# Patient Record
Sex: Female | Born: 1990 | Race: White | Hispanic: No | Smoking: Current every day smoker
Health system: Southern US, Community
[De-identification: ages and names within clinical notes are randomized; demographics above are authoritative.]

## PROBLEM LIST (undated history)

## (undated) DIAGNOSIS — N200 Calculus of kidney: Secondary | ICD-10-CM

## (undated) DIAGNOSIS — O1490 Unspecified pre-eclampsia, unspecified trimester: Secondary | ICD-10-CM

## (undated) HISTORY — PX: CHOLECYSTECTOMY: SHX55

## (undated) HISTORY — PX: LITHOTRIPSY: SUR834

## (undated) HISTORY — DX: Unspecified pre-eclampsia, unspecified trimester: O14.90

---

## 2019-12-09 ENCOUNTER — Ambulatory Visit: Payer: Self-pay

## 2019-12-14 ENCOUNTER — Ambulatory Visit: Payer: Self-pay | Admitting: Internal Medicine

## 2019-12-14 ENCOUNTER — Encounter: Payer: Self-pay | Admitting: Internal Medicine

## 2019-12-14 ENCOUNTER — Other Ambulatory Visit: Payer: Self-pay

## 2019-12-14 VITALS — BP 141/98 | HR 98 | Temp 98.4°F | Ht 67.5 in | Wt 201.2 lb

## 2019-12-14 DIAGNOSIS — Z6831 Body mass index (BMI) 31.0-31.9, adult: Secondary | ICD-10-CM

## 2019-12-14 DIAGNOSIS — G43809 Other migraine, not intractable, without status migrainosus: Secondary | ICD-10-CM

## 2019-12-14 DIAGNOSIS — R030 Elevated blood-pressure reading, without diagnosis of hypertension: Secondary | ICD-10-CM

## 2019-12-14 DIAGNOSIS — E669 Obesity, unspecified: Secondary | ICD-10-CM

## 2019-12-14 DIAGNOSIS — F419 Anxiety disorder, unspecified: Secondary | ICD-10-CM

## 2019-12-14 DIAGNOSIS — G43909 Migraine, unspecified, not intractable, without status migrainosus: Secondary | ICD-10-CM | POA: Insufficient documentation

## 2019-12-14 DIAGNOSIS — G919 Hydrocephalus, unspecified: Secondary | ICD-10-CM | POA: Insufficient documentation

## 2019-12-14 DIAGNOSIS — R61 Generalized hyperhidrosis: Secondary | ICD-10-CM

## 2019-12-14 HISTORY — DX: Hydrocephalus, unspecified: G91.9

## 2019-12-14 LAB — POCT GLYCOSYLATED HEMOGLOBIN (HGB A1C): Hemoglobin A1C: 5.3 % (ref 4.0–5.6)

## 2019-12-14 LAB — GLUCOSE, CAPILLARY: Glucose-Capillary: 100 mg/dL — ABNORMAL HIGH (ref 70–99)

## 2019-12-14 MED ORDER — GLYCOPYRRONIUM TOSYLATE 2.4 % EX PADS: 1.0000 "application " | MEDICATED_PAD | Freq: Every day | CUTANEOUS | 0 refills | Status: DC
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
## 2019-12-14 NOTE — Patient Instructions (Addendum)
It was a pleasure to see you today Ms. Katie Robertson. Please make the following changes:  For your excess swelling: -please start taking glycopyrronium daily  -return in 1 month   For your high blood pressure readings:  Please make sure to eat healthy and exercise 30 min daily  You have been referred to a dietician  If you have any questions or concerns, please call our clinic at [phone] between 9am-5pm and after hours call [phone] and ask for the internal medicine resident on call. If you feel you are having a medical emergency please call 911.   Thank you, we look forward to help you remain healthy!  Lorenso Courier, MD Internal Medicine PGY3  To schedule an appointment for a COVID vaccine or be added to the vaccine wait list: Go to [email or web address]   OR Go to [email or web address]                  OR Call [phone]                                     OR Call [phone] and select Option 2

## 2019-12-14 NOTE — Assessment & Plan Note (Signed)
  States that she has 2-3 episodes of headaches per month. Can last upto 2 weeks. The headache is usually present all over, behind eyes, worsened by bright lights and loud noises, vomits occasionally.  Takes ibuprofen and puts a cool bag over head. Has been on imitrex prior. Had to go to emergency room occasionally and the headache would only subside with dilaudid.   Assessment and plan  Suggested per patient monitor her headaches and inform us if there is increasing intensity and frequency. Requested medical records from previous physician.

## 2019-12-14 NOTE — Progress Notes (Signed)
ROI forms faxed to [facility] and [facility].

## 2019-12-14 NOTE — Assessment & Plan Note (Signed)
Patient's blood pressure during this visit was 135/101 and repeat was 149/98. The patient is obese with a BMI of 31.  Assessment and plan The patient may have hypertension which needs to be diagnosed with repeat blood pressure measurement at follow-up visit. In the meantime I have referred to dietitian to advise on lifestyle modifications.

## 2019-12-14 NOTE — Assessment & Plan Note (Addendum)
Patient states that she has been having excessive sweating in axillary region both during and day and throughout the year. Denies fevers/chills.  States that she has been having to wear a black shirt so that no one can see her excessive sweat.  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  Ordered CBC, CMP, HIV, TSH, A1c to determine if there is any secondary causes of hyperhidrosis.  Prescribed glyccopyronium to assist with excessive sweating.   Addendum Patient stated that she wanted to work with financial services first to get insurance prior to ordering these tests.

## 2019-12-15 ENCOUNTER — Telehealth: Payer: Self-pay | Admitting: Internal Medicine

## 2019-12-15 LAB — CMP14 + ANION GAP
ALT: 15 IU/L (ref 0–32)
AST: 15 IU/L (ref 0–40)
Albumin/Globulin Ratio: 1.9 (ref 1.2–2.2)
Albumin: 4.7 g/dL (ref 3.9–5.0)
Alkaline Phosphatase: 87 IU/L (ref 39–117)
Anion Gap: 15 mmol/L (ref 10.0–18.0)
BUN/Creatinine Ratio: 16 (ref 9–23)
BUN: 15 mg/dL (ref 6–20)
Bilirubin Total: 0.4 mg/dL (ref 0.0–1.2)
CO2: 24 mmol/L (ref 20–29)
Calcium: 9.5 mg/dL (ref 8.7–10.2)
Chloride: 104 mmol/L (ref 96–106)
Creatinine, Ser: 0.91 mg/dL (ref 0.57–1.00)
GFR calc Af Amer: 99 mL/min/{1.73_m2} (ref >59)
GFR calc non Af Amer: 86 mL/min/{1.73_m2} (ref >59)
Globulin, Total: 2.5 g/dL (ref 1.5–4.5)
Glucose: 103 mg/dL — ABNORMAL HIGH (ref 65–99)
Potassium: 5 mmol/L (ref 3.5–5.2)
Sodium: 143 mmol/L (ref 134–144)
Total Protein: 7.2 g/dL (ref 6.0–8.5)

## 2019-12-15 LAB — CBC
Hematocrit: 43.9 % (ref 34.0–46.6)
Hemoglobin: 14.4 g/dL (ref 11.1–15.9)
MCH: 28.3 pg (ref 26.6–33.0)
MCHC: 32.8 g/dL (ref 31.5–35.7)
MCV: 86 fL (ref 79–97)
Platelets: 277 10*3/uL (ref 150–450)
RBC: 5.08 x10E6/uL (ref 3.77–5.28)
RDW: 12.6 % (ref 11.7–15.4)
WBC: 9.4 10*3/uL (ref 3.4–10.8)

## 2019-12-15 LAB — TSH: TSH: 2.3 u[IU]/mL (ref 0.450–4.500)

## 2019-12-15 NOTE — Addendum Note (Signed)
Addended by: Neomia Dear on: 12/15/2019 04:21 PM   Modules accepted: Orders

## 2019-12-15 NOTE — Telephone Encounter (Signed)
Refill Request-Pt is requesting a call back.  Pt states her Medicine listed below will be $700.00 and she can not afford it.  Pt want's to know what else she can use.   Glycopyrronium Tosylate 2.4 % PADS  [facility] - [place], [place] - [address]

## 2019-12-16 NOTE — Telephone Encounter (Signed)
Spoke with Katie Robertson who will discuss with pharmacy as to why the medication is so expensive. I am seeing approximate price listed as $25

## 2019-12-17 ENCOUNTER — Telehealth: Payer: Self-pay | Admitting: Internal Medicine

## 2019-12-17 NOTE — Telephone Encounter (Signed)
Pt contact pt [phone]

## 2019-12-17 NOTE — Telephone Encounter (Signed)
TC to patient, asking for update on RX, price?  States she left message yesterday.  Per telephone note yesterday, Dr. Delma Officer was asking Katie Robertson to check into this.  Will forward to [facility]. SChaplin, RN,BSN

## 2019-12-17 NOTE — Progress Notes (Signed)
Internal Medicine Clinic Attending  Case discussed with Dr. Chundi at the time of the visit.  We reviewed the resident's history and exam and pertinent patient test results.  I agree with the assessment, diagnosis, and plan of care documented in the resident's note. 

## 2019-12-20 ENCOUNTER — Other Ambulatory Visit: Payer: Self-pay | Admitting: Internal Medicine

## 2019-12-20 ENCOUNTER — Telehealth: Payer: Self-pay | Admitting: Internal Medicine

## 2019-12-20 MED ORDER — DRYSOL 20 % EX SOLN
Freq: Every day | CUTANEOUS | 0 refills | Status: DC
Start: 2019-12-20 — End: 2021-09-26

## 2019-12-20 NOTE — Progress Notes (Signed)
Prescribed drysol instead of glycopyrronium

## 2019-12-20 NOTE — Telephone Encounter (Signed)
Pls contact pt [phone]

## 2019-12-20 NOTE — Telephone Encounter (Signed)
The glycopyrronium tosylate 2.4% pads are 700.00 drysol dab o matic 14.+ I have called [facility] and the pharmacist states 700 is probably the cheapest pt will be able to get but the drysol should work and at 14.00 its much more affordable Dr Delma Officer pt has called numerous times. Please advise

## 2019-12-20 NOTE — Telephone Encounter (Signed)
I had already called her and told her to use deodorant with aluminum chloride hexahydrate. I can call in drysol if that is what she prefers.

## 2020-01-14 ENCOUNTER — Telehealth: Payer: Self-pay | Admitting: *Deleted

## 2020-01-14 ENCOUNTER — Ambulatory Visit: Payer: Self-pay

## 2020-01-14 NOTE — Telephone Encounter (Signed)
Attempts to call patient about missed appointment .  Unable to leave a message.  Angelina Ok RN 01/14/2020 11:49 AM

## 2021-09-21 ENCOUNTER — Emergency Department (HOSPITAL_COMMUNITY): Payer: Medicaid Other

## 2021-09-21 ENCOUNTER — Encounter (HOSPITAL_COMMUNITY): Payer: Self-pay | Admitting: Emergency Medicine

## 2021-09-21 ENCOUNTER — Inpatient Hospital Stay (HOSPITAL_COMMUNITY): Payer: Medicaid Other

## 2021-09-21 ENCOUNTER — Other Ambulatory Visit: Payer: Self-pay

## 2021-09-21 ENCOUNTER — Inpatient Hospital Stay (HOSPITAL_COMMUNITY)
Admission: EM | Admit: 2021-09-21 | Discharge: 2021-09-26 | DRG: 982 | Disposition: A | Discharge status: Rehabilitation Facility | Payer: Medicaid Other | Attending: General Surgery | Admitting: General Surgery

## 2021-09-21 DIAGNOSIS — S82001A Unspecified fracture of right patella, initial encounter for closed fracture: Secondary | ICD-10-CM | POA: Diagnosis not present

## 2021-09-21 DIAGNOSIS — Z20822 Contact with and (suspected) exposure to covid-19: Secondary | ICD-10-CM | POA: Diagnosis present

## 2021-09-21 DIAGNOSIS — H6123 Impacted cerumen, bilateral: Secondary | ICD-10-CM | POA: Diagnosis present

## 2021-09-21 DIAGNOSIS — M25569 Pain in unspecified knee: Secondary | ICD-10-CM

## 2021-09-21 DIAGNOSIS — Y92410 Unspecified street and highway as the place of occurrence of the external cause: Secondary | ICD-10-CM | POA: Diagnosis not present

## 2021-09-21 DIAGNOSIS — Z9049 Acquired absence of other specified parts of digestive tract: Secondary | ICD-10-CM

## 2021-09-21 DIAGNOSIS — R079 Chest pain, unspecified: Secondary | ICD-10-CM | POA: Diagnosis not present

## 2021-09-21 DIAGNOSIS — Z419 Encounter for procedure for purposes other than remedying health state, unspecified: Secondary | ICD-10-CM

## 2021-09-21 DIAGNOSIS — F17210 Nicotine dependence, cigarettes, uncomplicated: Secondary | ICD-10-CM | POA: Diagnosis present

## 2021-09-21 DIAGNOSIS — S2243XA Multiple fractures of ribs, bilateral, initial encounter for closed fracture: Secondary | ICD-10-CM | POA: Diagnosis present

## 2021-09-21 DIAGNOSIS — S82841A Displaced bimalleolar fracture of right lower leg, initial encounter for closed fracture: Secondary | ICD-10-CM

## 2021-09-21 DIAGNOSIS — S82401A Unspecified fracture of shaft of right fibula, initial encounter for closed fracture: Secondary | ICD-10-CM

## 2021-09-21 DIAGNOSIS — S270XXA Traumatic pneumothorax, initial encounter: Secondary | ICD-10-CM | POA: Diagnosis present

## 2021-09-21 DIAGNOSIS — H6120 Impacted cerumen, unspecified ear: Secondary | ICD-10-CM

## 2021-09-21 DIAGNOSIS — S27322A Contusion of lung, bilateral, initial encounter: Secondary | ICD-10-CM | POA: Diagnosis present

## 2021-09-21 DIAGNOSIS — N12 Tubulo-interstitial nephritis, not specified as acute or chronic: Secondary | ICD-10-CM

## 2021-09-21 DIAGNOSIS — T1490XA Injury, unspecified, initial encounter: Secondary | ICD-10-CM

## 2021-09-21 DIAGNOSIS — D62 Acute posthemorrhagic anemia: Secondary | ICD-10-CM | POA: Diagnosis not present

## 2021-09-21 DIAGNOSIS — S82201A Unspecified fracture of shaft of right tibia, initial encounter for closed fracture: Secondary | ICD-10-CM

## 2021-09-21 DIAGNOSIS — S022XXA Fracture of nasal bones, initial encounter for closed fracture: Secondary | ICD-10-CM

## 2021-09-21 DIAGNOSIS — R000 Tachycardia, unspecified: Secondary | ICD-10-CM | POA: Diagnosis not present

## 2021-09-21 DIAGNOSIS — S82851A Displaced trimalleolar fracture of right lower leg, initial encounter for closed fracture: Secondary | ICD-10-CM | POA: Diagnosis present

## 2021-09-21 DIAGNOSIS — R52 Pain, unspecified: Secondary | ICD-10-CM | POA: Diagnosis not present

## 2021-09-21 DIAGNOSIS — Z8249 Family history of ischemic heart disease and other diseases of the circulatory system: Secondary | ICD-10-CM

## 2021-09-21 DIAGNOSIS — S2220XA Unspecified fracture of sternum, initial encounter for closed fracture: Secondary | ICD-10-CM

## 2021-09-21 DIAGNOSIS — D649 Anemia, unspecified: Secondary | ICD-10-CM | POA: Diagnosis not present

## 2021-09-21 DIAGNOSIS — Z8781 Personal history of (healed) traumatic fracture: Secondary | ICD-10-CM

## 2021-09-21 DIAGNOSIS — Z833 Family history of diabetes mellitus: Secondary | ICD-10-CM | POA: Diagnosis not present

## 2021-09-21 DIAGNOSIS — T148XXA Other injury of unspecified body region, initial encounter: Secondary | ICD-10-CM

## 2021-09-21 DIAGNOSIS — Z79899 Other long term (current) drug therapy: Secondary | ICD-10-CM | POA: Diagnosis not present

## 2021-09-21 DIAGNOSIS — S93431A Sprain of tibiofibular ligament of right ankle, initial encounter: Secondary | ICD-10-CM | POA: Diagnosis present

## 2021-09-21 DIAGNOSIS — Z23 Encounter for immunization: Secondary | ICD-10-CM

## 2021-09-21 DIAGNOSIS — Z9889 Other specified postprocedural states: Secondary | ICD-10-CM

## 2021-09-21 DIAGNOSIS — I10 Essential (primary) hypertension: Secondary | ICD-10-CM | POA: Diagnosis not present

## 2021-09-21 DIAGNOSIS — S2249XA Multiple fractures of ribs, unspecified side, initial encounter for closed fracture: Secondary | ICD-10-CM

## 2021-09-21 HISTORY — DX: Calculus of kidney: N20.0

## 2021-09-21 LAB — I-STAT CHEM 8, ED
BUN: 11 mg/dL (ref 6–20)
Calcium, Ion: 1.03 mmol/L — ABNORMAL LOW (ref 1.15–1.40)
Chloride: 108 mmol/L (ref 98–111)
Creatinine, Ser: 0.7 mg/dL (ref 0.44–1.00)
Glucose, Bld: 113 mg/dL — ABNORMAL HIGH (ref 70–99)
HCT: 31 % — ABNORMAL LOW (ref 36.0–46.0)
Hemoglobin: 10.5 g/dL — ABNORMAL LOW (ref 12.0–15.0)
Potassium: 3.4 mmol/L — ABNORMAL LOW (ref 3.5–5.1)
Sodium: 142 mmol/L (ref 135–145)
TCO2: 24 mmol/L (ref 22–32)

## 2021-09-21 LAB — COMPREHENSIVE METABOLIC PANEL
ALT: 19 U/L (ref 0–44)
AST: 34 U/L (ref 15–41)
Albumin: 2.9 g/dL — ABNORMAL LOW (ref 3.5–5.0)
Alkaline Phosphatase: 58 U/L (ref 38–126)
Anion gap: 7 (ref 5–15)
BUN: 12 mg/dL (ref 6–20)
CO2: 23 mmol/L (ref 22–32)
Calcium: 7.8 mg/dL — ABNORMAL LOW (ref 8.9–10.3)
Chloride: 110 mmol/L (ref 98–111)
Creatinine, Ser: 0.8 mg/dL (ref 0.44–1.00)
GFR, Estimated: >60 mL/min (ref >60)
Glucose, Bld: 120 mg/dL — ABNORMAL HIGH (ref 70–99)
Potassium: 3.4 mmol/L — ABNORMAL LOW (ref 3.5–5.1)
Sodium: 140 mmol/L (ref 135–145)
Total Bilirubin: 0.5 mg/dL (ref 0.3–1.2)
Total Protein: 5.8 g/dL — ABNORMAL LOW (ref 6.5–8.1)

## 2021-09-21 LAB — URINALYSIS, ROUTINE W REFLEX MICROSCOPIC
Bilirubin Urine: NEGATIVE
Glucose, UA: NEGATIVE mg/dL
Hgb urine dipstick: NEGATIVE
Ketones, ur: 5 mg/dL — AB
Nitrite: NEGATIVE
Protein, ur: NEGATIVE mg/dL
Specific Gravity, Urine: 1.034 — ABNORMAL HIGH (ref 1.005–1.030)
pH: 7 (ref 5.0–8.0)

## 2021-09-21 LAB — RESP PANEL BY RT-PCR (FLU A&B, COVID) ARPGX2
Influenza A by PCR: NEGATIVE
Influenza B by PCR: NEGATIVE
SARS Coronavirus 2 by RT PCR: NEGATIVE

## 2021-09-21 LAB — CBC
HCT: 32.9 % — ABNORMAL LOW (ref 36.0–46.0)
Hemoglobin: 10.6 g/dL — ABNORMAL LOW (ref 12.0–15.0)
MCH: 27.4 pg (ref 26.0–34.0)
MCHC: 32.2 g/dL (ref 30.0–36.0)
MCV: 85 fL (ref 80.0–100.0)
Platelets: 280 10*3/uL (ref 150–400)
RBC: 3.87 MIL/uL (ref 3.87–5.11)
RDW: 12.1 % (ref 11.5–15.5)
WBC: 13.4 10*3/uL — ABNORMAL HIGH (ref 4.0–10.5)
nRBC: 0 % (ref 0.0–0.2)

## 2021-09-21 LAB — HIV ANTIBODY (ROUTINE TESTING W REFLEX): HIV Screen 4th Generation wRfx: NONREACTIVE

## 2021-09-21 LAB — SAMPLE TO BLOOD BANK

## 2021-09-21 LAB — I-STAT BETA HCG BLOOD, ED (MC, WL, AP ONLY): I-stat hCG, quantitative: <5 m[IU]/mL (ref <5)

## 2021-09-21 LAB — PROTIME-INR
INR: 1.1 (ref 0.8–1.2)
Prothrombin Time: 14.1 seconds (ref 11.4–15.2)

## 2021-09-21 LAB — ECHOCARDIOGRAM COMPLETE
Height: 67.5 in
S' Lateral: 3.77 cm
Weight: 3200 oz

## 2021-09-21 LAB — LACTIC ACID, PLASMA: Lactic Acid, Venous: 1.1 mmol/L (ref 0.5–1.9)

## 2021-09-21 LAB — ETHANOL: Alcohol, Ethyl (B): <10 mg/dL (ref <10)

## 2021-09-21 MED ORDER — PROPOFOL 10 MG/ML IV BOLUS
0.5000 mg/kg | Freq: Once | INTRAVENOUS | Status: AC
  Administered 2021-09-21: 45.4 mg via INTRAVENOUS

## 2021-09-21 MED ORDER — IOHEXOL 300 MG/ML  SOLN
100.0000 mL | Freq: Once | INTRAMUSCULAR | Status: AC | PRN
  Administered 2021-09-21: 100 mL via INTRAVENOUS

## 2021-09-21 MED ORDER — POTASSIUM CHLORIDE IN NACL 20-0.9 MEQ/L-% IV SOLN
INTRAVENOUS | Status: DC
  Filled 2021-09-21: qty 1000

## 2021-09-21 MED ORDER — QUETIAPINE FUMARATE 50 MG PO TABS
50.0000 mg | ORAL_TABLET | Freq: Every day | ORAL | Status: DC
  Administered 2021-09-21 – 2021-09-25 (×5): 50 mg via ORAL
  Filled 2021-09-21 (×5): qty 1

## 2021-09-21 MED ORDER — ONDANSETRON HCL 4 MG/2ML IJ SOLN
4.0000 mg | Freq: Four times a day (QID) | INTRAMUSCULAR | Status: DC | PRN
  Administered 2021-09-21: 4 mg via INTRAVENOUS
  Filled 2021-09-21: qty 2

## 2021-09-21 MED ORDER — METRONIDAZOLE 500 MG PO TABS
500.0000 mg | ORAL_TABLET | Freq: Two times a day (BID) | ORAL | Status: AC
  Administered 2021-09-21 – 2021-09-23 (×4): 500 mg via ORAL
  Filled 2021-09-21 (×4): qty 1

## 2021-09-21 MED ORDER — PROPOFOL 10 MG/ML IV BOLUS
0.5000 mg/kg | Freq: Once | INTRAVENOUS | Status: DC
  Filled 2021-09-21: qty 20

## 2021-09-21 MED ORDER — HYDROMORPHONE HCL 1 MG/ML IJ SOLN
1.0000 mg | INTRAMUSCULAR | Status: DC | PRN
  Administered 2021-09-21 – 2021-09-25 (×24): 1 mg via INTRAVENOUS
  Filled 2021-09-21 (×26): qty 1

## 2021-09-21 MED ORDER — HYDROMORPHONE HCL 1 MG/ML IJ SOLN: 0.5000 mg | Freq: Once | INTRAMUSCULAR | Status: DC | PRN

## 2021-09-21 MED ORDER — SODIUM CHLORIDE 0.9 % IV SOLN
1.0000 g | Freq: Once | INTRAVENOUS | Status: AC
  Administered 2021-09-21: 1 g via INTRAVENOUS
  Filled 2021-09-21: qty 10

## 2021-09-21 MED ORDER — HYDROMORPHONE HCL 1 MG/ML IJ SOLN
INTRAMUSCULAR | Status: AC
  Filled 2021-09-21: qty 1

## 2021-09-21 MED ORDER — OXYCODONE HCL 5 MG PO TABS
10.0000 mg | ORAL_TABLET | ORAL | Status: DC | PRN
  Administered 2021-09-22 – 2021-09-25 (×12): 10 mg via ORAL
  Filled 2021-09-21 (×13): qty 2

## 2021-09-21 MED ORDER — ONDANSETRON 4 MG PO TBDP: 4.0000 mg | ORAL_TABLET | Freq: Four times a day (QID) | ORAL | Status: DC | PRN

## 2021-09-21 MED ORDER — PROPOFOL 10 MG/ML IV BOLUS
INTRAVENOUS | Status: AC | PRN
  Administered 2021-09-21: 90 mg via INTRAVENOUS
  Administered 2021-09-21: 50 mg via INTRAVENOUS

## 2021-09-21 MED ORDER — HYDROMORPHONE HCL 1 MG/ML IJ SOLN
1.0000 mg | Freq: Once | INTRAMUSCULAR | Status: AC
  Administered 2021-09-21: 1 mg via INTRAVENOUS
  Filled 2021-09-21: qty 1

## 2021-09-21 MED ORDER — HYDROMORPHONE HCL 1 MG/ML IJ SOLN
0.5000 mg | INTRAMUSCULAR | Status: DC | PRN
  Administered 2021-09-21 (×3): 0.5 mg via INTRAVENOUS
  Filled 2021-09-21: qty 0.5
  Filled 2021-09-21 (×2): qty 1

## 2021-09-21 MED ORDER — ENOXAPARIN SODIUM 30 MG/0.3ML IJ SOSY
30.0000 mg | PREFILLED_SYRINGE | Freq: Two times a day (BID) | INTRAMUSCULAR | Status: DC
Start: 2021-09-22 — End: 2021-09-26
  Administered 2021-09-22 – 2021-09-26 (×8): 30 mg via SUBCUTANEOUS
  Filled 2021-09-21 (×8): qty 0.3

## 2021-09-21 MED ORDER — OXYCODONE HCL 5 MG PO TABS
5.0000 mg | ORAL_TABLET | ORAL | Status: DC | PRN
  Administered 2021-09-21: 5 mg via ORAL
  Filled 2021-09-21: qty 1

## 2021-09-21 MED ORDER — METHOCARBAMOL 1000 MG/10ML IJ SOLN
1000.0000 mg | Freq: Three times a day (TID) | INTRAVENOUS | Status: DC
  Administered 2021-09-21 – 2021-09-25 (×12): 1000 mg via INTRAVENOUS
  Filled 2021-09-21: qty 10
  Filled 2021-09-21: qty 1000
  Filled 2021-09-21: qty 10
  Filled 2021-09-21 (×2): qty 1000
  Filled 2021-09-21 (×4): qty 10
  Filled 2021-09-21 (×4): qty 1000
  Filled 2021-09-21: qty 10
  Filled 2021-09-21 (×2): qty 1000

## 2021-09-21 MED ORDER — METOPROLOL TARTRATE 5 MG/5ML IV SOLN
5.0000 mg | Freq: Four times a day (QID) | INTRAVENOUS | Status: DC | PRN
  Filled 2021-09-21: qty 5

## 2021-09-21 MED ORDER — TETANUS-DIPHTH-ACELL PERTUSSIS 5-2.5-18.5 LF-MCG/0.5 IM SUSY
0.5000 mL | PREFILLED_SYRINGE | Freq: Once | INTRAMUSCULAR | Status: AC
  Administered 2021-09-21: 0.5 mL via INTRAMUSCULAR
  Filled 2021-09-21: qty 0.5

## 2021-09-21 MED ORDER — PROPOFOL 10 MG/ML IV BOLUS
INTRAVENOUS | Status: AC | PRN
  Administered 2021-09-21: 50 mg via INTRAVENOUS
  Administered 2021-09-21 (×2): 40 mg via INTRAVENOUS

## 2021-09-21 MED ORDER — ACETAMINOPHEN 325 MG PO TABS
650.0000 mg | ORAL_TABLET | Freq: Four times a day (QID) | ORAL | Status: DC
  Administered 2021-09-21 – 2021-09-23 (×6): 650 mg via ORAL
  Filled 2021-09-21 (×6): qty 2

## 2021-09-21 MED ORDER — HYDROMORPHONE HCL 1 MG/ML IJ SOLN
1.0000 mg | Freq: Once | INTRAMUSCULAR | Status: AC | PRN
Start: 2021-09-21 — End: 2021-09-21
  Administered 2021-09-21: 1 mg via INTRAVENOUS
  Filled 2021-09-21: qty 1
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
## 2021-09-21 NOTE — Progress Notes (Signed)
Orthopedic Tech Progress Note Patient Details:  Katie Robertson 1991-07-09 [id number]  Ortho Devices Type of Ortho Device: Stirrup splint, Short leg splint Ortho Device/Splint Location: LLE Ortho Device/Splint Interventions: Ordered, Application   Post Interventions Patient Tolerated: Well  Katie Robertson 09/21/2021, 2:16 PM

## 2021-09-21 NOTE — ED Notes (Signed)
Called RN Domenic Schwab to verify room is clean. Clear to transport up.
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
## 2021-09-21 NOTE — Progress Notes (Signed)
°  Echocardiogram 2D Echocardiogram has been performed.  Delcie Roch 09/21/2021, 5:50 PM
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
## 2021-09-22 ENCOUNTER — Inpatient Hospital Stay (HOSPITAL_COMMUNITY): Payer: Medicaid Other

## 2021-09-22 LAB — CBC
HCT: 31.9 % — ABNORMAL LOW (ref 36.0–46.0)
Hemoglobin: 10.5 g/dL — ABNORMAL LOW (ref 12.0–15.0)
MCH: 27.3 pg (ref 26.0–34.0)
MCHC: 32.9 g/dL (ref 30.0–36.0)
MCV: 83.1 fL (ref 80.0–100.0)
Platelets: 255 10*3/uL (ref 150–400)
RBC: 3.84 MIL/uL — ABNORMAL LOW (ref 3.87–5.11)
RDW: 12.4 % (ref 11.5–15.5)
WBC: 11.4 10*3/uL — ABNORMAL HIGH (ref 4.0–10.5)
nRBC: 0 % (ref 0.0–0.2)

## 2021-09-22 LAB — BASIC METABOLIC PANEL
Anion gap: 9 (ref 5–15)
BUN: <5 mg/dL — ABNORMAL LOW (ref 6–20)
CO2: 22 mmol/L (ref 22–32)
Calcium: 8.3 mg/dL — ABNORMAL LOW (ref 8.9–10.3)
Chloride: 107 mmol/L (ref 98–111)
Creatinine, Ser: 0.63 mg/dL (ref 0.44–1.00)
GFR, Estimated: >60 mL/min (ref >60)
Glucose, Bld: 99 mg/dL (ref 70–99)
Potassium: 3.4 mmol/L — ABNORMAL LOW (ref 3.5–5.1)
Sodium: 138 mmol/L (ref 135–145)

## 2021-09-22 LAB — URINE CULTURE: Culture: NO GROWTH

## 2021-09-22 MED ORDER — POTASSIUM CHLORIDE 20 MEQ PO PACK
60.0000 meq | PACK | Freq: Once | ORAL | Status: AC
  Administered 2021-09-22: 60 meq via ORAL
  Filled 2021-09-22: qty 3

## 2021-09-22 MED ORDER — GABAPENTIN 300 MG PO CAPS
300.0000 mg | ORAL_CAPSULE | Freq: Three times a day (TID) | ORAL | Status: DC
  Administered 2021-09-22 – 2021-09-26 (×12): 300 mg via ORAL
  Filled 2021-09-22 (×13): qty 1
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
## 2021-09-22 NOTE — Progress Notes (Signed)
OT Cancellation Note  Patient Details Name: Jamaria Amborn MRN: [id number] DOB: 1991/03/26   Cancelled Treatment:    Reason Eval/Treat Not Completed: Other (comment).  Patient resting comfortably, sleeping soundly.  PT had attempted eval earlier.  OT to check later, and continue efforts as appropriate.    Lynann Demetrius D Lajean Boese 09/22/2021, 1:42 PM
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
## 2021-09-22 NOTE — Progress Notes (Signed)
°  Transition of Care [facility]) Screening Note   Patient Details  Name: Katie Robertson Date of Birth: Apr 15, 1991   Transition of Care [facility]) CM/SW Contact:    Windle Guard, LCSW Phone Number: 09/22/2021, 8:34 AM    Transition of Care Department [facility]) has reviewed patient and noted no immediate TOC needs pending continued medical work-up. TOC team will continue to monitor patient advancement through interdisciplinary progression rounds to support any identified discharge supports as needed. If new patient transition needs arise, please place a TOC consult or reach out to [facility] team.

## 2021-09-22 NOTE — Progress Notes (Signed)
Subjective/Chief Complaint: Complains of pain, but falls asleep midsentence   Objective: Vital signs in last 24 hours: Temp:  [97.7 F (36.5 C)-98.4 F (36.9 C)] 98.4 F (36.9 C) (02/11 0737) Pulse Rate:  [83-230] 83 (02/11 0737) Resp:  [0-24] 17 (02/11 0737) BP: (124-155)/(81-118) 151/98 (02/11 0737) SpO2:  [95 %-100 %] 98 % (02/11 0737) Weight:  [90.7 kg] 90.7 kg (02/10 1044)    Intake/Output from previous day: 02/10 0701 - 02/11 0700 In: 710.6 [I.V.:509.4; IV Piggyback:201.2] Out: 1150 [Urine:1150] Intake/Output this shift: No intake/output data recorded.  Tearful alternating with asleep Regular rate and rhythm Unlabored respirations, significant chest wall tenderness in the central portion Abdomen soft and nontender Right lower extremity splinted, neurovascularly intact bilaterally Skin warm and dry with scattered abrasions Neuro grossly intact  Lab Results:  Recent Labs    09/21/21 1858 09/22/21 0306  WBC 14.6* 11.4*  HGB 11.0* 10.5*  HCT 32.3* 31.9*  PLT 281 255   BMET Recent Labs    09/21/21 1052 09/21/21 1103 09/21/21 1858 09/22/21 0306  NA 140 142  --  138  K 3.4* 3.4*  --  3.4*  CL 110 108  --  107  CO2 23  --   --  22  GLUCOSE 120* 113*  --  99  BUN 12 11  --  <5*  CREATININE 0.80 0.70 0.60 0.63  CALCIUM 7.8*  --   --  8.3*   PT/INR Recent Labs    09/21/21 1052  LABPROT 14.1  INR 1.1   ABG No results for input(s): PHART, HCO3 in the last 72 hours.  Invalid input(s): PCO2, PO2  Studies/Results: DG Ankle 2 Views Right  Result Date: 09/21/2021 CLINICAL DATA:  Status post reduction. EXAM: RIGHT ANKLE - 2 VIEW COMPARISON:  Same day. FINDINGS: Right ankle has been casted and immobilized. Severely displaced and comminuted distal right fibular fracture is noted as well as severely displaced medial malleolar fracture. There remains significant lateral talar dislocation relative to distal tibia. IMPRESSION: Continued lateral talotibial  dislocation with severely displaced distal right tibial and fibular fractures. Electronically Signed   By: Lupita Raider M.D.   On: 09/21/2021 14:58   CT HEAD WO CONTRAST  Result Date: 09/21/2021 CLINICAL DATA:  A 31 year old female presents for evaluation of blunt trauma. EXAM: CT HEAD WITHOUT CONTRAST CT CERVICAL SPINE WITHOUT CONTRAST CT CHEST, ABDOMEN AND PELVIS WITHOUT CONTRAST TECHNIQUE: Contiguous axial images were obtained from the base of the skull through the vertex without intravenous contrast. Multidetector CT imaging of the cervical spine was performed without intravenous contrast. Multiplanar CT image reconstructions were also generated. Multidetector CT imaging of the chest, abdomen and pelvis was performed following the standard protocol without IV contrast. RADIATION DOSE REDUCTION: This exam was performed according to the departmental dose-optimization program which includes automated exposure control, adjustment of the mA and/or kV according to patient size and/or use of iterative reconstruction technique. COMPARISON:  None FINDINGS: CT HEAD FINDINGS Brain: No evidence of acute infarction, hemorrhage, hydrocephalus, extra-axial collection or mass lesion/mass effect. Vascular: No hyperdense vessel or unexpected calcification. Skull: Normal. Negative for fracture or focal lesion. Sinuses/Orbits: Mucosal thickening of the RIGHT maxillary sinus. Scattered opacification of the ethmoid air cells and secretions in the nasal cavity. Mucosal thickening of the sphenoid sinus. Bilateral mildly displaced nasal bone fractures with mild to moderate rightward deviation. Buckling of the nasal septum as well compatible with fracture in this area. See dedicated face CT for further detail regarding facial  fractures. Other: Soft tissue swelling about the nose not well evaluated on the current study. CT CERVICAL FINDINGS Alignment: Mild reversal of normal cervical lordotic curvature in the upper cervical spine  and straightening is likely due to patient position and or spasm. Skull base and vertebrae: No acute fracture. No primary bone lesion or focal pathologic process. Soft tissues and spinal canal: No prevertebral fluid or swelling. No visible canal hematoma. Disc levels:  Disc levels without substantial degenerative changes. Other: Signs of ground-glass at the lung apices better demonstrated on recent chest CT. RIGHT first rib fracture partially imaged. CT CHEST FINDINGS Cardiovascular: The aorta a displays a smooth contour. No periaortic stranding or signs of thickening. No pericardial effusion. Normal heart size. Central pulmonary vasculature is normal caliber. Mediastinum/Nodes: Mild stranding in the anterior mediastinal fat maintaining a normal triangular morphology with "marbled" appearance and without focal dense features. No signs of axillary, thoracic inlet, mediastinal or hilar adenopathy. No pneumomediastinum. Lungs/Pleura: Tiny inferior anterior pneumothorax, just anterior to the RIGHT hemidiaphragm on image 57/3. Scattered areas of ground-glass opacity throughout the chest anteriorly adjacent mainly to rib fractures and worse on the RIGHT than on the LEFT. No LEFT-sided pneumothorax. Airways are patent. There is basilar atelectasis as well. Musculoskeletal: Visualized clavicles and scapulae are intact. Contusion overlies the upper RIGHT chest and medial breast. Numerous RIGHT-sided rib fractures beginning with a RIGHT anterior first rib fracture showing mild comminution and mild displacement. Longitudinal fracture of the RIGHT second rib. This is seen along the anterior RIGHT second rib. Fractures of RIGHT fifth, sixth and seventh ribs along the lateral aspect of the chest. The show minimal displacement. Fractures of LEFT-sided ribs 3, 4 and 5 just lateral to costochondral junctions. Also with a LEFT sixth and seventh rib fracture all with no substantial displacement. Minimally displaced fracture of the  sternum, sternal body. See below for full musculoskeletal details. CT ABDOMEN AND PELVIS FINDINGS Hepatobiliary: Signs of hepatic steatosis. No signs of hepatic trauma. Moderate intra and extrahepatic biliary duct distension following cholecystectomy. Only recent prior from September 15, 2021 is available for comparison. No perihepatic fluid. Study mildly limited by arm position. Pancreas: Normal, without mass, inflammation or ductal dilatation. Spleen: Top normal size. No signs of splenic trauma with small clefts along the periphery of the spleen. No perisplenic stranding. Adrenals/Urinary Tract: Mild stranding about the RIGHT kidney with focal hypoattenuation but without visible laceration. Mild striated nephrogram seen as well on the RIGHT. Normal appearance of the LEFT kidney and adrenal glands. Urinary bladder with smooth contours. Stomach/Bowel: No signs of acute gastrointestinal process. No focal bowel wall thickening or stranding adjacent to small or large bowel. Stomach grossly unremarkable by CT without focal thickening or adjacent stranding. Vascular/Lymphatic: Smooth contour of the aorta. No focal aortic thickening or adjacent stranding. No signs of adenopathy in the abdomen or in the pelvis. There is no gastrohepatic or hepatoduodenal ligament lymphadenopathy. No retroperitoneal or mesenteric lymphadenopathy. No pelvic sidewall lymphadenopathy. Reproductive: Unremarkable by CT. Other: Trace fluid in the pelvis. No pneumoperitoneum. No substantial body wall contusion over the abdomen. Musculoskeletal: Bony pelvis is intact. Thoracolumbar spine with normal alignment. No signs of spinal fracture. IMPRESSION: 1. No acute intracranial abnormality. 2. No evidence of acute fracture or static subluxation of the cervical spine. 3. Mildly displaced nasal bone fractures and fracture of the nasal septum. See dedicated face CT for further detail. 4. Bilateral rib fractures including RIGHT first rib fracture with  scattered areas of pulmonary contusion and tiny RIGHT pneumothorax.  5. Minimally displaced sternal fracture as well. 6. Stranding in the anterior mediastinum that does not deform the contour of the aorta and shows an appearance that is almost certainly related to residual thymic tissue. No signs of aortic abnormality on this non gated study. Consider follow-up echocardiography or gated chest CT as warranted given pattern of injuries. 7. No acute traumatic injury to the abdomen or pelvis. 8. Moderate intra and extrahepatic biliary duct distension following cholecystectomy. Only recent prior from September 15, 2021 is available for comparison. Findings may be slightly worse than on the prior study which is very recent. Correlation with hepatic enzymes may be helpful to determine whether follow-up may be warranted. Comparison with more remote studies would also be helpful. 9. RIGHT renal contusion versus is focal nephritis in the interpolar RIGHT kidney. Correlation with urinalysis may be helpful. These results were called by telephone at the time of interpretation on 09/21/2021 at 1:30 pm to provider Pricilla LovelessSCOTT GOLDSTON , who verbally acknowledged these results. Electronically Signed   By: Donzetta KohutGeoffrey  Wile M.D.   On: 09/21/2021 13:30   CT CERVICAL SPINE WO CONTRAST  Result Date: 09/21/2021 CLINICAL DATA:  A [age] year old female presents for evaluation of blunt trauma. EXAM: CT HEAD WITHOUT CONTRAST CT CERVICAL SPINE WITHOUT CONTRAST CT CHEST, ABDOMEN AND PELVIS WITHOUT CONTRAST TECHNIQUE: Contiguous axial images were obtained from the base of the skull through the vertex without intravenous contrast. Multidetector CT imaging of the cervical spine was performed without intravenous contrast. Multiplanar CT image reconstructions were also generated. Multidetector CT imaging of the chest, abdomen and pelvis was performed following the standard protocol without IV contrast. RADIATION DOSE REDUCTION: This exam was performed  according to the departmental dose-optimization program which includes automated exposure control, adjustment of the mA and/or kV according to patient size and/or use of iterative reconstruction technique. COMPARISON:  None FINDINGS: CT HEAD FINDINGS Brain: No evidence of acute infarction, hemorrhage, hydrocephalus, extra-axial collection or mass lesion/mass effect. Vascular: No hyperdense vessel or unexpected calcification. Skull: Normal. Negative for fracture or focal lesion. Sinuses/Orbits: Mucosal thickening of the RIGHT maxillary sinus. Scattered opacification of the ethmoid air cells and secretions in the nasal cavity. Mucosal thickening of the sphenoid sinus. Bilateral mildly displaced nasal bone fractures with mild to moderate rightward deviation. Buckling of the nasal septum as well compatible with fracture in this area. See dedicated face CT for further detail regarding facial fractures. Other: Soft tissue swelling about the nose not well evaluated on the current study. CT CERVICAL FINDINGS Alignment: Mild reversal of normal cervical lordotic curvature in the upper cervical spine and straightening is likely due to patient position and or spasm. Skull base and vertebrae: No acute fracture. No primary bone lesion or focal pathologic process. Soft tissues and spinal canal: No prevertebral fluid or swelling. No visible canal hematoma. Disc levels:  Disc levels without substantial degenerative changes. Other: Signs of ground-glass at the lung apices better demonstrated on recent chest CT. RIGHT first rib fracture partially imaged. CT CHEST FINDINGS Cardiovascular: The aorta a displays a smooth contour. No periaortic stranding or signs of thickening. No pericardial effusion. Normal heart size. Central pulmonary vasculature is normal caliber. Mediastinum/Nodes: Mild stranding in the anterior mediastinal fat maintaining a normal triangular morphology with "marbled" appearance and without focal dense features. No  signs of axillary, thoracic inlet, mediastinal or hilar adenopathy. No pneumomediastinum. Lungs/Pleura: Tiny inferior anterior pneumothorax, just anterior to the RIGHT hemidiaphragm on image 57/3. Scattered areas of ground-glass opacity throughout the chest anteriorly adjacent  mainly to rib fractures and worse on the RIGHT than on the LEFT. No LEFT-sided pneumothorax. Airways are patent. There is basilar atelectasis as well. Musculoskeletal: Visualized clavicles and scapulae are intact. Contusion overlies the upper RIGHT chest and medial breast. Numerous RIGHT-sided rib fractures beginning with a RIGHT anterior first rib fracture showing mild comminution and mild displacement. Longitudinal fracture of the RIGHT second rib. This is seen along the anterior RIGHT second rib. Fractures of RIGHT fifth, sixth and seventh ribs along the lateral aspect of the chest. The show minimal displacement. Fractures of LEFT-sided ribs 3, 4 and 5 just lateral to costochondral junctions. Also with a LEFT sixth and seventh rib fracture all with no substantial displacement. Minimally displaced fracture of the sternum, sternal body. See below for full musculoskeletal details. CT ABDOMEN AND PELVIS FINDINGS Hepatobiliary: Signs of hepatic steatosis. No signs of hepatic trauma. Moderate intra and extrahepatic biliary duct distension following cholecystectomy. Only recent prior from September 15, 2021 is available for comparison. No perihepatic fluid. Study mildly limited by arm position. Pancreas: Normal, without mass, inflammation or ductal dilatation. Spleen: Top normal size. No signs of splenic trauma with small clefts along the periphery of the spleen. No perisplenic stranding. Adrenals/Urinary Tract: Mild stranding about the RIGHT kidney with focal hypoattenuation but without visible laceration. Mild striated nephrogram seen as well on the RIGHT. Normal appearance of the LEFT kidney and adrenal glands. Urinary bladder with smooth  contours. Stomach/Bowel: No signs of acute gastrointestinal process. No focal bowel wall thickening or stranding adjacent to small or large bowel. Stomach grossly unremarkable by CT without focal thickening or adjacent stranding. Vascular/Lymphatic: Smooth contour of the aorta. No focal aortic thickening or adjacent stranding. No signs of adenopathy in the abdomen or in the pelvis. There is no gastrohepatic or hepatoduodenal ligament lymphadenopathy. No retroperitoneal or mesenteric lymphadenopathy. No pelvic sidewall lymphadenopathy. Reproductive: Unremarkable by CT. Other: Trace fluid in the pelvis. No pneumoperitoneum. No substantial body wall contusion over the abdomen. Musculoskeletal: Bony pelvis is intact. Thoracolumbar spine with normal alignment. No signs of spinal fracture. IMPRESSION: 1. No acute intracranial abnormality. 2. No evidence of acute fracture or static subluxation of the cervical spine. 3. Mildly displaced nasal bone fractures and fracture of the nasal septum. See dedicated face CT for further detail. 4. Bilateral rib fractures including RIGHT first rib fracture with scattered areas of pulmonary contusion and tiny RIGHT pneumothorax. 5. Minimally displaced sternal fracture as well. 6. Stranding in the anterior mediastinum that does not deform the contour of the aorta and shows an appearance that is almost certainly related to residual thymic tissue. No signs of aortic abnormality on this non gated study. Consider follow-up echocardiography or gated chest CT as warranted given pattern of injuries. 7. No acute traumatic injury to the abdomen or pelvis. 8. Moderate intra and extrahepatic biliary duct distension following cholecystectomy. Only recent prior from September 15, 2021 is available for comparison. Findings may be slightly worse than on the prior study which is very recent. Correlation with hepatic enzymes may be helpful to determine whether follow-up may be warranted. Comparison with more  remote studies would also be helpful. 9. RIGHT renal contusion versus is focal nephritis in the interpolar RIGHT kidney. Correlation with urinalysis may be helpful. These results were called by telephone at the time of interpretation on 09/21/2021 at 1:30 pm to provider Pricilla Loveless , who verbally acknowledged these results. Electronically Signed   By: Donzetta Kohut M.D.   On: 09/21/2021 13:30   DG  Pelvis Portable  Result Date: 09/21/2021 CLINICAL DATA:  Trauma EXAM: PORTABLE PELVIS 1-2 VIEWS COMPARISON:  None. FINDINGS: There is no radiographically evident pelvic fracture on single frontal view. IMPRESSION: No radiographically evident pelvic fracture.  CT has been ordered. Electronically Signed   By: Caprice Renshaw M.D.   On: 09/21/2021 11:18   CT CHEST ABDOMEN PELVIS W CONTRAST  Result Date: 09/21/2021 CLINICAL DATA:  A 31 year old female presents for evaluation of blunt trauma. EXAM: CT HEAD WITHOUT CONTRAST CT CERVICAL SPINE WITHOUT CONTRAST CT CHEST, ABDOMEN AND PELVIS WITHOUT CONTRAST TECHNIQUE: Contiguous axial images were obtained from the base of the skull through the vertex without intravenous contrast. Multidetector CT imaging of the cervical spine was performed without intravenous contrast. Multiplanar CT image reconstructions were also generated. Multidetector CT imaging of the chest, abdomen and pelvis was performed following the standard protocol without IV contrast. RADIATION DOSE REDUCTION: This exam was performed according to the departmental dose-optimization program which includes automated exposure control, adjustment of the mA and/or kV according to patient size and/or use of iterative reconstruction technique. COMPARISON:  None FINDINGS: CT HEAD FINDINGS Brain: No evidence of acute infarction, hemorrhage, hydrocephalus, extra-axial collection or mass lesion/mass effect. Vascular: No hyperdense vessel or unexpected calcification. Skull: Normal. Negative for fracture or focal lesion.  Sinuses/Orbits: Mucosal thickening of the RIGHT maxillary sinus. Scattered opacification of the ethmoid air cells and secretions in the nasal cavity. Mucosal thickening of the sphenoid sinus. Bilateral mildly displaced nasal bone fractures with mild to moderate rightward deviation. Buckling of the nasal septum as well compatible with fracture in this area. See dedicated face CT for further detail regarding facial fractures. Other: Soft tissue swelling about the nose not well evaluated on the current study. CT CERVICAL FINDINGS Alignment: Mild reversal of normal cervical lordotic curvature in the upper cervical spine and straightening is likely due to patient position and or spasm. Skull base and vertebrae: No acute fracture. No primary bone lesion or focal pathologic process. Soft tissues and spinal canal: No prevertebral fluid or swelling. No visible canal hematoma. Disc levels:  Disc levels without substantial degenerative changes. Other: Signs of ground-glass at the lung apices better demonstrated on recent chest CT. RIGHT first rib fracture partially imaged. CT CHEST FINDINGS Cardiovascular: The aorta a displays a smooth contour. No periaortic stranding or signs of thickening. No pericardial effusion. Normal heart size. Central pulmonary vasculature is normal caliber. Mediastinum/Nodes: Mild stranding in the anterior mediastinal fat maintaining a normal triangular morphology with "marbled" appearance and without focal dense features. No signs of axillary, thoracic inlet, mediastinal or hilar adenopathy. No pneumomediastinum. Lungs/Pleura: Tiny inferior anterior pneumothorax, just anterior to the RIGHT hemidiaphragm on image 57/3. Scattered areas of ground-glass opacity throughout the chest anteriorly adjacent mainly to rib fractures and worse on the RIGHT than on the LEFT. No LEFT-sided pneumothorax. Airways are patent. There is basilar atelectasis as well. Musculoskeletal: Visualized clavicles and scapulae are  intact. Contusion overlies the upper RIGHT chest and medial breast. Numerous RIGHT-sided rib fractures beginning with a RIGHT anterior first rib fracture showing mild comminution and mild displacement. Longitudinal fracture of the RIGHT second rib. This is seen along the anterior RIGHT second rib. Fractures of RIGHT fifth, sixth and seventh ribs along the lateral aspect of the chest. The show minimal displacement. Fractures of LEFT-sided ribs 3, 4 and 5 just lateral to costochondral junctions. Also with a LEFT sixth and seventh rib fracture all with no substantial displacement. Minimally displaced fracture of the sternum, sternal body.  See below for full musculoskeletal details. CT ABDOMEN AND PELVIS FINDINGS Hepatobiliary: Signs of hepatic steatosis. No signs of hepatic trauma. Moderate intra and extrahepatic biliary duct distension following cholecystectomy. Only recent prior from September 15, 2021 is available for comparison. No perihepatic fluid. Study mildly limited by arm position. Pancreas: Normal, without mass, inflammation or ductal dilatation. Spleen: Top normal size. No signs of splenic trauma with small clefts along the periphery of the spleen. No perisplenic stranding. Adrenals/Urinary Tract: Mild stranding about the RIGHT kidney with focal hypoattenuation but without visible laceration. Mild striated nephrogram seen as well on the RIGHT. Normal appearance of the LEFT kidney and adrenal glands. Urinary bladder with smooth contours. Stomach/Bowel: No signs of acute gastrointestinal process. No focal bowel wall thickening or stranding adjacent to small or large bowel. Stomach grossly unremarkable by CT without focal thickening or adjacent stranding. Vascular/Lymphatic: Smooth contour of the aorta. No focal aortic thickening or adjacent stranding. No signs of adenopathy in the abdomen or in the pelvis. There is no gastrohepatic or hepatoduodenal ligament lymphadenopathy. No retroperitoneal or mesenteric  lymphadenopathy. No pelvic sidewall lymphadenopathy. Reproductive: Unremarkable by CT. Other: Trace fluid in the pelvis. No pneumoperitoneum. No substantial body wall contusion over the abdomen. Musculoskeletal: Bony pelvis is intact. Thoracolumbar spine with normal alignment. No signs of spinal fracture. IMPRESSION: 1. No acute intracranial abnormality. 2. No evidence of acute fracture or static subluxation of the cervical spine. 3. Mildly displaced nasal bone fractures and fracture of the nasal septum. See dedicated face CT for further detail. 4. Bilateral rib fractures including RIGHT first rib fracture with scattered areas of pulmonary contusion and tiny RIGHT pneumothorax. 5. Minimally displaced sternal fracture as well. 6. Stranding in the anterior mediastinum that does not deform the contour of the aorta and shows an appearance that is almost certainly related to residual thymic tissue. No signs of aortic abnormality on this non gated study. Consider follow-up echocardiography or gated chest CT as warranted given pattern of injuries. 7. No acute traumatic injury to the abdomen or pelvis. 8. Moderate intra and extrahepatic biliary duct distension following cholecystectomy. Only recent prior from September 15, 2021 is available for comparison. Findings may be slightly worse than on the prior study which is very recent. Correlation with hepatic enzymes may be helpful to determine whether follow-up may be warranted. Comparison with more remote studies would also be helpful. 9. RIGHT renal contusion versus is focal nephritis in the interpolar RIGHT kidney. Correlation with urinalysis may be helpful. These results were called by telephone at the time of interpretation on 09/21/2021 at 1:30 pm to provider Pricilla Loveless , who verbally acknowledged these results. Electronically Signed   By: Donzetta Kohut M.D.   On: 09/21/2021 13:30   DG Chest Port 1 View  Result Date: 09/21/2021 CLINICAL DATA:  Trauma EXAM:  PORTABLE CHEST 1 VIEW COMPARISON:  None. FINDINGS: Mildly widened mediastinum. Normal cardiac silhouette. Focal opacity in the right mid lung. No large pleural effusion. No visible pneumothorax. No acute osseous abnormality. IMPRESSION: Mildly widened mediastinum. Focal opacity in the right mid lung, possibly contusion. No visible pneumothorax or large effusion. Correlate with CT of the chest, which has been ordered. Electronically Signed   By: Caprice Renshaw M.D.   On: 09/21/2021 11:16   DG Ankle Right Port  Result Date: 09/21/2021 CLINICAL DATA:  Status post reduction EXAM: PORTABLE RIGHT ANKLE - 2 VIEW COMPARISON: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

## 2021-09-22 NOTE — Progress Notes (Addendum)
7619 Pt. Off the floor with transport for CT   0629 Pt. Back on floor
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
## 2021-09-22 NOTE — Progress Notes (Signed)
Inpatient Rehab Admissions Coordinator Note:   Per PT/OT patient was screened for CIR candidacy by Malek Skog Luvenia Starch, CCC-SLP. At this time, pt is not medically ready for CIR. Pt has pending ORIF on 09/24/21. Note pt also pain limiting during therapy sessions.  [facility] team will follow from a distance to monitor pt's medical workup and progress with therapies.      Wolfgang Phoenix, MS, CCC-SLP Admissions Coordinator [phone] 09/22/21 7:18 PM

## 2021-09-23 LAB — BASIC METABOLIC PANEL
Anion gap: 9 (ref 5–15)
BUN: <5 mg/dL — ABNORMAL LOW (ref 6–20)
CO2: 23 mmol/L (ref 22–32)
Calcium: 8.6 mg/dL — ABNORMAL LOW (ref 8.9–10.3)
Chloride: 107 mmol/L (ref 98–111)
Creatinine, Ser: 0.67 mg/dL (ref 0.44–1.00)
GFR, Estimated: >60 mL/min (ref >60)
Glucose, Bld: 114 mg/dL — ABNORMAL HIGH (ref 70–99)
Potassium: 3.8 mmol/L (ref 3.5–5.1)
Sodium: 139 mmol/L (ref 135–145)

## 2021-09-23 LAB — CBC
HCT: 34.2 % — ABNORMAL LOW (ref 36.0–46.0)
Hemoglobin: 11.4 g/dL — ABNORMAL LOW (ref 12.0–15.0)
MCH: 27.5 pg (ref 26.0–34.0)
MCHC: 33.3 g/dL (ref 30.0–36.0)
MCV: 82.6 fL (ref 80.0–100.0)
Platelets: 277 10*3/uL (ref 150–400)
RBC: 4.14 MIL/uL (ref 3.87–5.11)
RDW: 12.6 % (ref 11.5–15.5)
WBC: 12.1 10*3/uL — ABNORMAL HIGH (ref 4.0–10.5)
nRBC: 0 % (ref 0.0–0.2)

## 2021-09-23 LAB — MAGNESIUM: Magnesium: 2.1 mg/dL (ref 1.7–2.4)

## 2021-09-23 MED ORDER — ACETAMINOPHEN 500 MG PO TABS
1000.0000 mg | ORAL_TABLET | Freq: Four times a day (QID) | ORAL | Status: DC
  Administered 2021-09-23 – 2021-09-26 (×12): 1000 mg via ORAL
  Filled 2021-09-23 (×14): qty 2
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
## 2021-09-23 NOTE — Progress Notes (Signed)
Orthopedic Tech Progress Note Patient Details:  Katie Robertson 1991/02/06 [id number]  Ortho Devices Type of Ortho Device: Knee Immobilizer Ortho Device/Splint Location: rle Ortho Device/Splint Interventions: Ordered, Application, Adjustment   Post Interventions Patient Tolerated: Well  Al Decant 09/23/2021, 9:44 AM

## 2021-09-23 NOTE — Progress Notes (Signed)
Consent signed and placed in the patient's chart for ORIF of right ankle. Patient and her mother have been updated of preliminary time of surgery tomorrow.

## 2021-09-23 NOTE — Progress Notes (Signed)
Subjective/Chief Complaint: Ongoing issues with pain in the right lower extremity and chest wall.  Plain films yesterday demonstrated additional fractures in the right knee   Objective: Vital signs in last 24 hours: Temp:  [98 F (36.7 C)-98.9 F (37.2 C)] 98.6 F (37 C) (02/12 0714) Pulse Rate:  [86-105] 87 (02/12 0714) Resp:  [12-18] 15 (02/12 0714) BP: (139-159)/(95-110) 149/104 (02/12 0714) SpO2:  [97 %-100 %] 98 % (02/12 0714) Last BM Date: 09/20/21  Intake/Output from previous day: 02/11 0701 - 02/12 0700 In: -  Out: 1300 [Urine:1300] Intake/Output this shift: No intake/output data recorded.  Tearful but no acute distress Regular rate and rhythm Unlabored respirations, significant chest wall tenderness in the central portion Abdomen soft and nontender Right lower extremity splinted, neurovascularly intact bilaterally Skin warm and dry with scattered abrasions Neuro grossly intact  Lab Results:  Recent Labs    09/22/21 0306 09/23/21 0423  WBC 11.4* 12.1*  HGB 10.5* 11.4*  HCT 31.9* 34.2*  PLT 255 277    BMET Recent Labs    09/22/21 0306 09/23/21 0423  NA 138 139  K 3.4* 3.8  CL 107 107  CO2 22 23  GLUCOSE 99 114*  BUN <5* <5*  CREATININE 0.63 0.67  CALCIUM 8.3* 8.6*    PT/INR Recent Labs    09/21/21 1052  LABPROT 14.1  INR 1.1    ABG No results for input(s): PHART, HCO3 in the last 72 hours.  Invalid input(s): PCO2, PO2  Studies/Results: DG Ankle 2 Views Right  Result Date: 09/21/2021 CLINICAL DATA:  Status post reduction. EXAM: RIGHT ANKLE - 2 VIEW COMPARISON:  Same day. FINDINGS: Right ankle has been casted and immobilized. Severely displaced and comminuted distal right fibular fracture is noted as well as severely displaced medial malleolar fracture. There remains significant lateral talar dislocation relative to distal tibia. IMPRESSION: Continued lateral talotibial dislocation with severely displaced distal right tibial and  fibular fractures. Electronically Signed   By: Lupita Raider M.D.   On: 09/21/2021 14:58   CT HEAD WO CONTRAST  Result Date: 09/21/2021 CLINICAL DATA:  A 31 year old female presents for evaluation of blunt trauma. EXAM: CT HEAD WITHOUT CONTRAST CT CERVICAL SPINE WITHOUT CONTRAST CT CHEST, ABDOMEN AND PELVIS WITHOUT CONTRAST TECHNIQUE: Contiguous axial images were obtained from the base of the skull through the vertex without intravenous contrast. Multidetector CT imaging of the cervical spine was performed without intravenous contrast. Multiplanar CT image reconstructions were also generated. Multidetector CT imaging of the chest, abdomen and pelvis was performed following the standard protocol without IV contrast. RADIATION DOSE REDUCTION: This exam was performed according to the departmental dose-optimization program which includes automated exposure control, adjustment of the mA and/or kV according to patient size and/or use of iterative reconstruction technique. COMPARISON:  None FINDINGS: CT HEAD FINDINGS Brain: No evidence of acute infarction, hemorrhage, hydrocephalus, extra-axial collection or mass lesion/mass effect. Vascular: No hyperdense vessel or unexpected calcification. Skull: Normal. Negative for fracture or focal lesion. Sinuses/Orbits: Mucosal thickening of the RIGHT maxillary sinus. Scattered opacification of the ethmoid air cells and secretions in the nasal cavity. Mucosal thickening of the sphenoid sinus. Bilateral mildly displaced nasal bone fractures with mild to moderate rightward deviation. Buckling of the nasal septum as well compatible with fracture in this area. See dedicated face CT for further detail regarding facial fractures. Other: Soft tissue swelling about the nose not well evaluated on the current study. CT CERVICAL FINDINGS Alignment: Mild reversal of normal cervical lordotic curvature  in the upper cervical spine and straightening is likely due to patient position and or  spasm. Skull base and vertebrae: No acute fracture. No primary bone lesion or focal pathologic process. Soft tissues and spinal canal: No prevertebral fluid or swelling. No visible canal hematoma. Disc levels:  Disc levels without substantial degenerative changes. Other: Signs of ground-glass at the lung apices better demonstrated on recent chest CT. RIGHT first rib fracture partially imaged. CT CHEST FINDINGS Cardiovascular: The aorta a displays a smooth contour. No periaortic stranding or signs of thickening. No pericardial effusion. Normal heart size. Central pulmonary vasculature is normal caliber. Mediastinum/Nodes: Mild stranding in the anterior mediastinal fat maintaining a normal triangular morphology with "marbled" appearance and without focal dense features. No signs of axillary, thoracic inlet, mediastinal or hilar adenopathy. No pneumomediastinum. Lungs/Pleura: Tiny inferior anterior pneumothorax, just anterior to the RIGHT hemidiaphragm on image 57/3. Scattered areas of ground-glass opacity throughout the chest anteriorly adjacent mainly to rib fractures and worse on the RIGHT than on the LEFT. No LEFT-sided pneumothorax. Airways are patent. There is basilar atelectasis as well. Musculoskeletal: Visualized clavicles and scapulae are intact. Contusion overlies the upper RIGHT chest and medial breast. Numerous RIGHT-sided rib fractures beginning with a RIGHT anterior first rib fracture showing mild comminution and mild displacement. Longitudinal fracture of the RIGHT second rib. This is seen along the anterior RIGHT second rib. Fractures of RIGHT fifth, sixth and seventh ribs along the lateral aspect of the chest. The show minimal displacement. Fractures of LEFT-sided ribs 3, 4 and 5 just lateral to costochondral junctions. Also with a LEFT sixth and seventh rib fracture all with no substantial displacement. Minimally displaced fracture of the sternum, sternal body. See below for full musculoskeletal  details. CT ABDOMEN AND PELVIS FINDINGS Hepatobiliary: Signs of hepatic steatosis. No signs of hepatic trauma. Moderate intra and extrahepatic biliary duct distension following cholecystectomy. Only recent prior from September 15, 2021 is available for comparison. No perihepatic fluid. Study mildly limited by arm position. Pancreas: Normal, without mass, inflammation or ductal dilatation. Spleen: Top normal size. No signs of splenic trauma with small clefts along the periphery of the spleen. No perisplenic stranding. Adrenals/Urinary Tract: Mild stranding about the RIGHT kidney with focal hypoattenuation but without visible laceration. Mild striated nephrogram seen as well on the RIGHT. Normal appearance of the LEFT kidney and adrenal glands. Urinary bladder with smooth contours. Stomach/Bowel: No signs of acute gastrointestinal process. No focal bowel wall thickening or stranding adjacent to small or large bowel. Stomach grossly unremarkable by CT without focal thickening or adjacent stranding. Vascular/Lymphatic: Smooth contour of the aorta. No focal aortic thickening or adjacent stranding. No signs of adenopathy in the abdomen or in the pelvis. There is no gastrohepatic or hepatoduodenal ligament lymphadenopathy. No retroperitoneal or mesenteric lymphadenopathy. No pelvic sidewall lymphadenopathy. Reproductive: Unremarkable by CT. Other: Trace fluid in the pelvis. No pneumoperitoneum. No substantial body wall contusion over the abdomen. Musculoskeletal: Bony pelvis is intact. Thoracolumbar spine with normal alignment. No signs of spinal fracture. IMPRESSION: 1. No acute intracranial abnormality. 2. No evidence of acute fracture or static subluxation of the cervical spine. 3. Mildly displaced nasal bone fractures and fracture of the nasal septum. See dedicated face CT for further detail. 4. Bilateral rib fractures including RIGHT first rib fracture with scattered areas of pulmonary contusion and tiny RIGHT  pneumothorax. 5. Minimally displaced sternal fracture as well. 6. Stranding in the anterior mediastinum that does not deform the contour of the aorta and shows an appearance  that is almost certainly related to residual thymic tissue. No signs of aortic abnormality on this non gated study. Consider follow-up echocardiography or gated chest CT as warranted given pattern of injuries. 7. No acute traumatic injury to the abdomen or pelvis. 8. Moderate intra and extrahepatic biliary duct distension following cholecystectomy. Only recent prior from September 15, 2021 is available for comparison. Findings may be slightly worse than on the prior study which is very recent. Correlation with hepatic enzymes may be helpful to determine whether follow-up may be warranted. Comparison with more remote studies would also be helpful. 9. RIGHT renal contusion versus is focal nephritis in the interpolar RIGHT kidney. Correlation with urinalysis may be helpful. These results were called by telephone at the time of interpretation on 09/21/2021 at 1:30 pm to provider Pricilla Loveless , who verbally acknowledged these results. Electronically Signed   By: Donzetta Kohut M.D.   On: 09/21/2021 13:30   CT CERVICAL SPINE WO CONTRAST  Result Date: 09/21/2021 CLINICAL DATA:  A 31 year old female presents for evaluation of blunt trauma. EXAM: CT HEAD WITHOUT CONTRAST CT CERVICAL SPINE WITHOUT CONTRAST CT CHEST, ABDOMEN AND PELVIS WITHOUT CONTRAST TECHNIQUE: Contiguous axial images were obtained from the base of the skull through the vertex without intravenous contrast. Multidetector CT imaging of the cervical spine was performed without intravenous contrast. Multiplanar CT image reconstructions were also generated. Multidetector CT imaging of the chest, abdomen and pelvis was performed following the standard protocol without IV contrast. RADIATION DOSE REDUCTION: This exam was performed according to the departmental dose-optimization program which  includes automated exposure control, adjustment of the mA and/or kV according to patient size and/or use of iterative reconstruction technique. COMPARISON:  None FINDINGS: CT HEAD FINDINGS Brain: No evidence of acute infarction, hemorrhage, hydrocephalus, extra-axial collection or mass lesion/mass effect. Vascular: No hyperdense vessel or unexpected calcification. Skull: Normal. Negative for fracture or focal lesion. Sinuses/Orbits: Mucosal thickening of the RIGHT maxillary sinus. Scattered opacification of the ethmoid air cells and secretions in the nasal cavity. Mucosal thickening of the sphenoid sinus. Bilateral mildly displaced nasal bone fractures with mild to moderate rightward deviation. Buckling of the nasal septum as well compatible with fracture in this area. See dedicated face CT for further detail regarding facial fractures. Other: Soft tissue swelling about the nose not well evaluated on the current study. CT CERVICAL FINDINGS Alignment: Mild reversal of normal cervical lordotic curvature in the upper cervical spine and straightening is likely due to patient position and or spasm. Skull base and vertebrae: No acute fracture. No primary bone lesion or focal pathologic process. Soft tissues and spinal canal: No prevertebral fluid or swelling. No visible canal hematoma. Disc levels:  Disc levels without substantial degenerative changes. Other: Signs of ground-glass at the lung apices better demonstrated on recent chest CT. RIGHT first rib fracture partially imaged. CT CHEST FINDINGS Cardiovascular: The aorta a displays a smooth contour. No periaortic stranding or signs of thickening. No pericardial effusion. Normal heart size. Central pulmonary vasculature is normal caliber. Mediastinum/Nodes: Mild stranding in the anterior mediastinal fat maintaining a normal triangular morphology with "marbled" appearance and without focal dense features. No signs of axillary, thoracic inlet, mediastinal or hilar  adenopathy. No pneumomediastinum. Lungs/Pleura: Tiny inferior anterior pneumothorax, just anterior to the RIGHT hemidiaphragm on image 57/3. Scattered areas of ground-glass opacity throughout the chest anteriorly adjacent mainly to rib fractures and worse on the RIGHT than on the LEFT. No LEFT-sided pneumothorax. Airways are patent. There is basilar atelectasis as well. Musculoskeletal:  Visualized clavicles and scapulae are intact. Contusion overlies the upper RIGHT chest and medial breast. Numerous RIGHT-sided rib fractures beginning with a RIGHT anterior first rib fracture showing mild comminution and mild displacement. Longitudinal fracture of the RIGHT second rib. This is seen along the anterior RIGHT second rib. Fractures of RIGHT fifth, sixth and seventh ribs along the lateral aspect of the chest. The show minimal displacement. Fractures of LEFT-sided ribs 3, 4 and 5 just lateral to costochondral junctions. Also with a LEFT sixth and seventh rib fracture all with no substantial displacement. Minimally displaced fracture of the sternum, sternal body. See below for full musculoskeletal details. CT ABDOMEN AND PELVIS FINDINGS Hepatobiliary: Signs of hepatic steatosis. No signs of hepatic trauma. Moderate intra and extrahepatic biliary duct distension following cholecystectomy. Only recent prior from September 15, 2021 is available for comparison. No perihepatic fluid. Study mildly limited by arm position. Pancreas: Normal, without mass, inflammation or ductal dilatation. Spleen: Top normal size. No signs of splenic trauma with small clefts along the periphery of the spleen. No perisplenic stranding. Adrenals/Urinary Tract: Mild stranding about the RIGHT kidney with focal hypoattenuation but without visible laceration. Mild striated nephrogram seen as well on the RIGHT. Normal appearance of the LEFT kidney and adrenal glands. Urinary bladder with smooth contours. Stomach/Bowel: No signs of acute gastrointestinal  process. No focal bowel wall thickening or stranding adjacent to small or large bowel. Stomach grossly unremarkable by CT without focal thickening or adjacent stranding. Vascular/Lymphatic: Smooth contour of the aorta. No focal aortic thickening or adjacent stranding. No signs of adenopathy in the abdomen or in the pelvis. There is no gastrohepatic or hepatoduodenal ligament lymphadenopathy. No retroperitoneal or mesenteric lymphadenopathy. No pelvic sidewall lymphadenopathy. Reproductive: Unremarkable by CT. Other: Trace fluid in the pelvis. No pneumoperitoneum. No substantial body wall contusion over the abdomen. Musculoskeletal: Bony pelvis is intact. Thoracolumbar spine with normal alignment. No signs of spinal fracture. IMPRESSION: 1. No acute intracranial abnormality. 2. No evidence of acute fracture or static subluxation of the cervical spine. 3. Mildly displaced nasal bone fractures and fracture of the nasal septum. See dedicated face CT for further detail. 4. Bilateral rib fractures including RIGHT first rib fracture with scattered areas of pulmonary contusion and tiny RIGHT pneumothorax. 5. Minimally displaced sternal fracture as well. 6. Stranding in the anterior mediastinum that does not deform the contour of the aorta and shows an appearance that is almost certainly related to residual thymic tissue. No signs of aortic abnormality on this non gated study. Consider follow-up echocardiography or gated chest CT as warranted given pattern of injuries. 7. No acute traumatic injury to the abdomen or pelvis. 8. Moderate intra and extrahepatic biliary duct distension following cholecystectomy. Only recent prior from September 15, 2021 is available for comparison. Findings may be slightly worse than on the prior study which is very recent. Correlation with hepatic enzymes may be helpful to determine whether follow-up may be warranted. Comparison with more remote studies would also be helpful. 9. RIGHT renal  contusion versus is focal nephritis in the interpolar RIGHT kidney. Correlation with urinalysis may be helpful. These results were called by telephone at the time of interpretation on 09/21/2021 at 1:30 pm to provider Pricilla Loveless , who verbally acknowledged these results. Electronically Signed   By: Donzetta Kohut M.D.   On: 09/21/2021 13:30   CT Ankle Right Wo Contrast  Result Date: 09/22/2021 CLINICAL DATA:  Right ankle fracture status post reduction. Preoperative planning EXAM: CT OF THE RIGHT ANKLE WITHOUT  CONTRAST TECHNIQUE: Multidetector CT imaging of the right ankle was performed according to the standard protocol. Multiplanar CT image reconstructions were also generated. RADIATION DOSE REDUCTION: This exam was performed according to the departmental dose-optimization program which includes automated exposure control, adjustment of the mA and/or kV according to patient size and/or use of iterative reconstruction technique. COMPARISON:  X-ray 09/21/2021 FINDINGS: Bones/Joint/Cartilage Acute trimalleolar fracture of the right ankle status post reduction. Obliquely oriented comminuted fracture through the base of the medial malleolus in near anatomic alignment. Small vertically oriented posterior malleolar fracture with minimal posterolateral displacement. Heavily comminuted spiral fracture of the distal fibular metaphysis with innumerable tiny comminuted fracture fragments. Overall fibular fracture alignment is near anatomic. Small cortical avulsion fractures from the lateral aspect of the tibial plafond likely tibiofibular ligament avulsion fractures. Good alignment of the ankle mortise without dislocation. Anterior process of the calcaneus is elongated with advanced degenerative changes between the process and lateral navicular. Appearance suggests calcaneonavicular tarsal coalition. Age advanced degenerative changes within the midfoot and at the TMT joints. No additional fractures are seen. Ligaments  Suboptimally assessed by CT. Muscles and Tendons No acute musculotendinous abnormality by CT. The posteromedial ankle tendons closely approximate the distal tibial fracture sites. Soft tissues Marked circumferential soft tissue swelling with ill-defined hematoma about the ankle. No soft tissue air is seen to suggest open fracture. IMPRESSION: 1. Acute trimalleolar fracture of the right ankle status post reduction. 2. Findings suggestive of calcaneonavicular tarsal coalition. 3. Age advanced degenerative changes within the midfoot and at the TMT joints. Electronically Signed   By: Duanne GuessNicholas  Plundo D.O.   On: 09/22/2021 11:16   DG Pelvis Portable  Result Date: 09/21/2021 CLINICAL DATA:  Trauma EXAM: PORTABLE PELVIS 1-2 VIEWS COMPARISON:  None. FINDINGS: There is no radiographically evident pelvic fracture on single frontal view. IMPRESSION: No radiographically evident pelvic fracture.  CT has been ordered. Electronically Signed   By: Caprice RenshawJacob  Kahn M.D.   On: 09/21/2021 11:18   CT CHEST ABDOMEN PELVIS W CONTRAST  Result Date: 09/21/2021 CLINICAL DATA:  A 31 year old female presents for evaluation of blunt trauma. EXAM: CT HEAD WITHOUT CONTRAST CT CERVICAL SPINE WITHOUT CONTRAST CT CHEST, ABDOMEN AND PELVIS WITHOUT CONTRAST TECHNIQUE: Contiguous axial images were obtained from the base of the skull through the vertex without intravenous contrast. Multidetector CT imaging of the cervical spine was performed without intravenous contrast. Multiplanar CT image reconstructions were also generated. Multidetector CT imaging of the chest, abdomen and pelvis was performed following the standard protocol without IV contrast. RADIATION DOSE REDUCTION: This exam was performed according to the departmental dose-optimization program which includes automated exposure control, adjustment of the mA and/or kV according to patient size and/or use of iterative reconstruction technique. COMPARISON:  None FINDINGS: CT HEAD FINDINGS  Brain: No evidence of acute infarction, hemorrhage, hydrocephalus, extra-axial collection or mass lesion/mass effect. Vascular: No hyperdense vessel or unexpected calcification. Skull: Normal. Negative for fracture or focal lesion. Sinuses/Orbits: Mucosal thickening of the RIGHT maxillary sinus. Scattered opacification of the ethmoid air cells and secretions in the nasal cavity. Mucosal thickening of the sphenoid sinus. Bilateral mildly displaced nasal bone fractures with mild to moderate rightward deviation. Buckling of the nasal septum as well compatible with fracture in this area. See dedicated face CT for further detail regarding facial fractures. Other: Soft tissue swelling about the nose not well evaluated on the current study. CT CERVICAL FINDINGS Alignment: Mild reversal of normal cervical lordotic curvature in the upper cervical spine and straightening is likely  due to patient position and or spasm. Skull base and vertebrae: No acute fracture. No primary bone lesion or focal pathologic process. Soft tissues and spinal canal: No prevertebral fluid or swelling. No visible canal hematoma. Disc levels:  Disc levels without substantial degenerative changes. Other: Signs of ground-glass at the lung apices better demonstrated on recent chest CT. RIGHT first rib fracture partially imaged. CT CHEST FINDINGS Cardiovascular: The aorta a displays a smooth contour. No periaortic stranding or signs of thickening. No pericardial effusion. Normal heart size. Central pulmonary vasculature is normal caliber. Mediastinum/Nodes: Mild stranding in the anterior mediastinal fat maintaining a normal triangular morphology with "marbled" appearance and without focal dense features. No signs of axillary, thoracic inlet, mediastinal or hilar adenopathy. No pneumomediastinum. Lungs/Pleura: Tiny inferior anterior pneumothorax, just anterior to the RIGHT hemidiaphragm on image 57/3. Scattered areas of ground-glass opacity throughout the  chest anteriorly adjacent mainly to rib fractures and worse on the RIGHT than on the LEFT. No LEFT-sided pneumothorax. Airways are patent. There is basilar atelectasis as well. Musculoskeletal: Visualized clavicles and scapulae are intact. Contusion overlies the upper RIGHT chest and medial breast. Numerous RIGHT-sided rib fractures beginning with a RIGHT anterior first rib fracture showing mild comminution and mild displacement. Longitudinal fracture of the RIGHT second rib. This is seen along the anterior RIGHT second rib. Fractures of RIGHT fifth, sixth and seventh ribs along the lateral aspect of the chest. The show minimal displacement. Fractures of LEFT-sided ribs 3, 4 and 5 just lateral to costochondral junctions. Also with a LEFT sixth and seventh rib fracture all with no substantial displacement. Minimally displaced fracture of the sternum, sternal body. See below for full musculoskeletal details. CT ABDOMEN AND PELVIS FINDINGS Hepatobiliary: Signs of hepatic steatosis. No signs of hepatic trauma. Moderate intra and extrahepatic biliary duct distension following cholecystectomy. Only recent prior from September 15, 2021 is available for comparison. No perihepatic fluid. Study mildly limited by arm position. Pancreas: Normal, without mass, inflammation or ductal dilatation. Spleen: Top normal size. No signs of splenic trauma with small clefts along the periphery of the spleen. No perisplenic stranding. Adrenals/Urinary Tract: Mild stranding about the RIGHT kidney with focal hypoattenuation but without visible laceration. Mild striated nephrogram seen as well on the RIGHT. Normal appearance of the LEFT kidney and adrenal glands. Urinary bladder with smooth contours. Stomach/Bowel: No signs of acute gastrointestinal process. No focal bowel wall thickening or stranding adjacent to small or large bowel. Stomach grossly unremarkable by CT without focal thickening or adjacent stranding. Vascular/Lymphatic: Smooth  contour of the aorta. No focal aortic thickening or adjacent stranding. No signs of adenopathy in the abdomen or in the pelvis. There is no gastrohepatic or hepatoduodenal ligament lymphadenopathy. No retroperitoneal or mesenteric lymphadenopathy. No pelvic sidewall lymphadenopathy. Reproductive: Unremarkable by CT. Other: Trace fluid in the pelvis. No pneumoperitoneum. No substantial body wall contusion over the abdomen. Musculoskeletal: Bony pelvis is intact. Thoracolumbar spine with normal alignment. No signs of spinal fracture. IMPRESSION: 1. No acute intracranial abnormality. 2. No evidence of acute fracture or static subluxation of the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

## 2021-09-24 ENCOUNTER — Inpatient Hospital Stay (HOSPITAL_COMMUNITY): Payer: Medicaid Other

## 2021-09-24 ENCOUNTER — Other Ambulatory Visit: Payer: Self-pay

## 2021-09-24 ENCOUNTER — Encounter (HOSPITAL_COMMUNITY): Admission: EM | Disposition: A | Discharge status: Rehabilitation Facility | Payer: Self-pay | Source: Home / Self Care

## 2021-09-24 ENCOUNTER — Inpatient Hospital Stay (HOSPITAL_COMMUNITY): Payer: Medicaid Other | Admitting: Anesthesiology

## 2021-09-24 DIAGNOSIS — I10 Essential (primary) hypertension: Secondary | ICD-10-CM

## 2021-09-24 DIAGNOSIS — D649 Anemia, unspecified: Secondary | ICD-10-CM

## 2021-09-24 DIAGNOSIS — S82851A Displaced trimalleolar fracture of right lower leg, initial encounter for closed fracture: Secondary | ICD-10-CM

## 2021-09-24 DIAGNOSIS — S82001A Unspecified fracture of right patella, initial encounter for closed fracture: Secondary | ICD-10-CM

## 2021-09-24 HISTORY — PX: ORIF ANKLE FRACTURE: SHX5408

## 2021-09-24 LAB — CBC
HCT: 36.8 % (ref 36.0–46.0)
Hemoglobin: 12.1 g/dL (ref 12.0–15.0)
MCH: 27.6 pg (ref 26.0–34.0)
MCHC: 32.9 g/dL (ref 30.0–36.0)
MCV: 83.8 fL (ref 80.0–100.0)
Platelets: 326 10*3/uL (ref 150–400)
RBC: 4.39 MIL/uL (ref 3.87–5.11)
RDW: 12.9 % (ref 11.5–15.5)
WBC: 10.2 10*3/uL (ref 4.0–10.5)
nRBC: 0 % (ref 0.0–0.2)

## 2021-09-24 LAB — BASIC METABOLIC PANEL
Anion gap: 8 (ref 5–15)
BUN: 11 mg/dL (ref 6–20)
CO2: 25 mmol/L (ref 22–32)
Calcium: 8.7 mg/dL — ABNORMAL LOW (ref 8.9–10.3)
Chloride: 107 mmol/L (ref 98–111)
Creatinine, Ser: 0.69 mg/dL (ref 0.44–1.00)
GFR, Estimated: >60 mL/min (ref >60)
Glucose, Bld: 104 mg/dL — ABNORMAL HIGH (ref 70–99)
Potassium: 4.1 mmol/L (ref 3.5–5.1)
Sodium: 140 mmol/L (ref 135–145)

## 2021-09-24 LAB — VITAMIN D 25 HYDROXY (VIT D DEFICIENCY, FRACTURES): Vit D, 25-Hydroxy: 17.89 ng/mL — ABNORMAL LOW (ref 30–100)

## 2021-09-24 SURGERY — OPEN REDUCTION INTERNAL FIXATION (ORIF) ANKLE FRACTURE
Anesthesia: Regional | Site: Ankle | Laterality: Right

## 2021-09-24 MED ORDER — OXYCODONE HCL 5 MG/5ML PO SOLN: 5.0000 mg | Freq: Once | ORAL | Status: DC | PRN

## 2021-09-24 MED ORDER — ACETAMINOPHEN 160 MG/5ML PO SOLN: 325.0000 mg | ORAL | Status: DC | PRN

## 2021-09-24 MED ORDER — POVIDONE-IODINE 10 % EX SWAB: 2.0000 "application " | Freq: Once | CUTANEOUS | Status: DC

## 2021-09-24 MED ORDER — MEPERIDINE HCL 25 MG/ML IJ SOLN: 6.2500 mg | INTRAMUSCULAR | Status: DC | PRN

## 2021-09-24 MED ORDER — CHLORHEXIDINE GLUCONATE 4 % EX LIQD
60.0000 mL | Freq: Once | CUTANEOUS | Status: AC
  Administered 2021-09-24: 4 via TOPICAL
  Filled 2021-09-24: qty 60

## 2021-09-24 MED ORDER — DOCUSATE SODIUM 100 MG PO CAPS
100.0000 mg | ORAL_CAPSULE | Freq: Two times a day (BID) | ORAL | Status: DC
  Administered 2021-09-24 – 2021-09-26 (×5): 100 mg via ORAL
  Filled 2021-09-24 (×5): qty 1

## 2021-09-24 MED ORDER — CEFAZOLIN SODIUM-DEXTROSE 2-4 GM/100ML-% IV SOLN
2.0000 g | INTRAVENOUS | Status: AC
  Administered 2021-09-24: 2 g via INTRAVENOUS
  Filled 2021-09-24: qty 100

## 2021-09-24 MED ORDER — HYDROMORPHONE HCL 1 MG/ML IJ SOLN
INTRAMUSCULAR | Status: DC | PRN
  Administered 2021-09-24: .5 mg via INTRAVENOUS

## 2021-09-24 MED ORDER — BUPIVACAINE-EPINEPHRINE (PF) 0.5% -1:200000 IJ SOLN
INTRAMUSCULAR | Status: DC | PRN
  Administered 2021-09-24: 15 mL via PERINEURAL

## 2021-09-24 MED ORDER — FENTANYL CITRATE (PF) 250 MCG/5ML IJ SOLN
INTRAMUSCULAR | Status: DC | PRN
  Administered 2021-09-24 (×3): 50 ug via INTRAVENOUS
  Administered 2021-09-24: 25 ug via INTRAVENOUS
  Administered 2021-09-24: 50 ug via INTRAVENOUS
  Administered 2021-09-24: 25 ug via INTRAVENOUS

## 2021-09-24 MED ORDER — OXYCODONE HCL 5 MG PO TABS: 5.0000 mg | ORAL_TABLET | Freq: Once | ORAL | Status: DC | PRN

## 2021-09-24 MED ORDER — ONDANSETRON HCL 4 MG/2ML IJ SOLN: 4.0000 mg | Freq: Four times a day (QID) | INTRAMUSCULAR | Status: DC | PRN

## 2021-09-24 MED ORDER — DEXAMETHASONE SODIUM PHOSPHATE 10 MG/ML IJ SOLN
INTRAMUSCULAR | Status: DC | PRN
  Administered 2021-09-24: 10 mg via INTRAVENOUS

## 2021-09-24 MED ORDER — POLYETHYLENE GLYCOL 3350 17 G PO PACK: 17.0000 g | PACK | Freq: Every day | ORAL | Status: DC | PRN

## 2021-09-24 MED ORDER — FENTANYL CITRATE (PF) 100 MCG/2ML IJ SOLN
INTRAMUSCULAR | Status: AC
  Filled 2021-09-24: qty 2

## 2021-09-24 MED ORDER — FENTANYL CITRATE (PF) 100 MCG/2ML IJ SOLN
25.0000 ug | INTRAMUSCULAR | Status: DC | PRN
  Administered 2021-09-24: 50 ug via INTRAVENOUS

## 2021-09-24 MED ORDER — DEXMEDETOMIDINE (PRECEDEX) IN NS 20 MCG/5ML (4 MCG/ML) IV SYRINGE
PREFILLED_SYRINGE | INTRAVENOUS | Status: DC | PRN
  Administered 2021-09-24 (×2): 8 ug via INTRAVENOUS
  Administered 2021-09-24: 4 ug via INTRAVENOUS

## 2021-09-24 MED ORDER — LACTATED RINGERS IV SOLN: INTRAVENOUS | Status: DC | PRN

## 2021-09-24 MED ORDER — FENTANYL CITRATE (PF) 250 MCG/5ML IJ SOLN
INTRAMUSCULAR | Status: AC
  Filled 2021-09-24: qty 5

## 2021-09-24 MED ORDER — MIDAZOLAM HCL 2 MG/2ML IJ SOLN
INTRAMUSCULAR | Status: AC
  Filled 2021-09-24: qty 2

## 2021-09-24 MED ORDER — LIDOCAINE 2% (20 MG/ML) 5 ML SYRINGE
INTRAMUSCULAR | Status: AC
  Filled 2021-09-24: qty 5

## 2021-09-24 MED ORDER — PROPOFOL 10 MG/ML IV BOLUS
INTRAVENOUS | Status: DC | PRN
  Administered 2021-09-24: 150 mg via INTRAVENOUS

## 2021-09-24 MED ORDER — CEFAZOLIN SODIUM-DEXTROSE 2-4 GM/100ML-% IV SOLN
2.0000 g | Freq: Three times a day (TID) | INTRAVENOUS | Status: AC
  Administered 2021-09-24 – 2021-09-25 (×3): 2 g via INTRAVENOUS
  Filled 2021-09-24 (×3): qty 100

## 2021-09-24 MED ORDER — PROPOFOL 10 MG/ML IV BOLUS
INTRAVENOUS | Status: AC
  Filled 2021-09-24: qty 20

## 2021-09-24 MED ORDER — SALINE SPRAY 0.65 % NA SOLN
1.0000 | Freq: Three times a day (TID) | NASAL | Status: DC
  Administered 2021-09-24 – 2021-09-26 (×6): 1 via NASAL
  Filled 2021-09-24: qty 44

## 2021-09-24 MED ORDER — VANCOMYCIN HCL 1000 MG IV SOLR
INTRAVENOUS | Status: AC
  Filled 2021-09-24: qty 20

## 2021-09-24 MED ORDER — MIDAZOLAM HCL 2 MG/2ML IJ SOLN
INTRAMUSCULAR | Status: DC | PRN
  Administered 2021-09-24: 2 mg via INTRAVENOUS

## 2021-09-24 MED ORDER — HYDROMORPHONE HCL 1 MG/ML IJ SOLN
INTRAMUSCULAR | Status: AC
  Filled 2021-09-24: qty 0.5

## 2021-09-24 MED ORDER — METOCLOPRAMIDE HCL 5 MG/ML IJ SOLN: 5.0000 mg | Freq: Three times a day (TID) | INTRAMUSCULAR | Status: DC | PRN

## 2021-09-24 MED ORDER — PHENYLEPHRINE 40 MCG/ML (10ML) SYRINGE FOR IV PUSH (FOR BLOOD PRESSURE SUPPORT)
PREFILLED_SYRINGE | INTRAVENOUS | Status: DC | PRN
  Administered 2021-09-24: 160 ug via INTRAVENOUS
  Administered 2021-09-24 (×2): 120 ug via INTRAVENOUS

## 2021-09-24 MED ORDER — ONDANSETRON HCL 4 MG/2ML IJ SOLN
INTRAMUSCULAR | Status: AC
  Filled 2021-09-24: qty 2

## 2021-09-24 MED ORDER — LIDOCAINE 2% (20 MG/ML) 5 ML SYRINGE
INTRAMUSCULAR | Status: DC | PRN
  Administered 2021-09-24: 100 mg via INTRAVENOUS

## 2021-09-24 MED ORDER — ONDANSETRON HCL 4 MG/2ML IJ SOLN
INTRAMUSCULAR | Status: DC | PRN
  Administered 2021-09-24: 4 mg via INTRAVENOUS

## 2021-09-24 MED ORDER — BUPIVACAINE LIPOSOME 1.3 % IJ SUSP
INTRAMUSCULAR | Status: DC | PRN
  Administered 2021-09-24: 10 mL via PERINEURAL

## 2021-09-24 MED ORDER — PHENYLEPHRINE HCL-NACL 20-0.9 MG/250ML-% IV SOLN
INTRAVENOUS | Status: DC | PRN
Start: 2021-09-24 — End: 2021-09-24
  Administered 2021-09-24: 50 ug/min via INTRAVENOUS

## 2021-09-24 MED ORDER — DEXAMETHASONE SODIUM PHOSPHATE 10 MG/ML IJ SOLN
INTRAMUSCULAR | Status: AC
  Filled 2021-09-24: qty 1

## 2021-09-24 MED ORDER — ONDANSETRON HCL 4 MG PO TABS: 4.0000 mg | ORAL_TABLET | Freq: Four times a day (QID) | ORAL | Status: DC | PRN

## 2021-09-24 MED ORDER — ACETAMINOPHEN 325 MG PO TABS: 325.0000 mg | ORAL_TABLET | ORAL | Status: DC | PRN

## 2021-09-24 MED ORDER — ONDANSETRON HCL 4 MG/2ML IJ SOLN: 4.0000 mg | Freq: Once | INTRAMUSCULAR | Status: DC | PRN

## 2021-09-24 MED ORDER — METOCLOPRAMIDE HCL 10 MG PO TABS: 5.0000 mg | ORAL_TABLET | Freq: Three times a day (TID) | ORAL | Status: DC | PRN

## 2021-09-24 MED ORDER — SODIUM CHLORIDE 0.9 % IV SOLN: INTRAVENOUS | Status: DC

## 2021-09-24 MED ORDER — VANCOMYCIN HCL 1000 MG IV SOLR
INTRAVENOUS | Status: DC | PRN
  Administered 2021-09-24: 1000 mg via TOPICAL

## 2021-09-24 SURGICAL SUPPLY — 78 items
APL PRP STRL LF DISP 70% ISPRP (MISCELLANEOUS) ×1
BAG COUNTER SPONGE SURGICOUNT (BAG) ×2 IMPLANT
BAG SPNG CNTER NS LX DISP (BAG) ×1
BANDAGE ESMARK 6X9 LF (GAUZE/BANDAGES/DRESSINGS) ×1 IMPLANT
BIT DRILL QC 2.0 SHORT EVOS SM (DRILL) IMPLANT
BIT DRILL QC 2.5MM SHRT EVO SM (DRILL) IMPLANT
BNDG CMPR 9X6 STRL LF SNTH (GAUZE/BANDAGES/DRESSINGS) ×1
BNDG COHESIVE 4X5 TAN STRL (GAUZE/BANDAGES/DRESSINGS) ×2 IMPLANT
BNDG ELASTIC 3X5.8 VLCR STR LF (GAUZE/BANDAGES/DRESSINGS) ×1 IMPLANT
BNDG ELASTIC 4X5.8 VLCR STR LF (GAUZE/BANDAGES/DRESSINGS) IMPLANT
BNDG ELASTIC 6X5.8 VLCR STR LF (GAUZE/BANDAGES/DRESSINGS) IMPLANT
BNDG ESMARK 6X9 LF (GAUZE/BANDAGES/DRESSINGS) ×2
BRUSH SCRUB EZ PLAIN DRY (MISCELLANEOUS) ×4 IMPLANT
CHLORAPREP W/TINT 26 (MISCELLANEOUS) ×2 IMPLANT
COVER SURGICAL LIGHT HANDLE (MISCELLANEOUS) ×2 IMPLANT
DRAPE C-ARM 42X72 X-RAY (DRAPES) ×2 IMPLANT
DRAPE C-ARMOR (DRAPES) ×2 IMPLANT
DRAPE ORTHO SPLIT 77X108 STRL (DRAPES) ×4
DRAPE SURG ORHT 6 SPLT 77X108 (DRAPES) ×2 IMPLANT
DRAPE U-SHAPE 47X51 STRL (DRAPES) ×2 IMPLANT
DRILL QC 2.0 SHORT EVOS SM (DRILL) ×2
DRILL QC 2.5MM SHORT EVOS SM (DRILL) ×2
DRSG ADAPTIC 3X8 NADH LF (GAUZE/BANDAGES/DRESSINGS) ×1 IMPLANT
ELECT REM PT RETURN 9FT ADLT (ELECTROSURGICAL) ×2
ELECTRODE REM PT RTRN 9FT ADLT (ELECTROSURGICAL) ×1 IMPLANT
GAUZE SPONGE 4X4 12PLY STRL (GAUZE/BANDAGES/DRESSINGS) IMPLANT
GAUZE SPONGE 4X4 12PLY STRL LF (GAUZE/BANDAGES/DRESSINGS) ×1 IMPLANT
GLOVE SURG ENC MOIS LTX SZ6.5 (GLOVE) ×6 IMPLANT
GLOVE SURG ENC MOIS LTX SZ7.5 (GLOVE) ×6 IMPLANT
GLOVE SURG UNDER POLY LF SZ6.5 (GLOVE) ×2 IMPLANT
GLOVE SURG UNDER POLY LF SZ7.5 (GLOVE) ×2 IMPLANT
GOWN STRL REUS W/ TWL LRG LVL3 (GOWN DISPOSABLE) ×2 IMPLANT
GOWN STRL REUS W/TWL LRG LVL3 (GOWN DISPOSABLE) ×4
IMMOBILIZER KNEE 20 (SOFTGOODS) ×2 IMPLANT
IMMOBILIZER KNEE 20 THIGH 36 (SOFTGOODS) IMPLANT
K-WIRE 1.6 (WIRE) ×2
K-WIRE FX150X1.6XTROC PNT (WIRE) ×1
KIT INVISIKNOT ANKLE FRACTURE (Screw) ×1 IMPLANT
KIT TURNOVER KIT B (KITS) ×2 IMPLANT
KWIRE FX150X1.6XTROC PNT (WIRE) IMPLANT
MANIFOLD NEPTUNE II (INSTRUMENTS) ×2 IMPLANT
NDL HYPO 21X1.5 SAFETY (NEEDLE) IMPLANT
NDL HYPO 25GX1X1/2 BEV (NEEDLE) ×1 IMPLANT
NEEDLE HYPO 21X1.5 SAFETY (NEEDLE) IMPLANT
NEEDLE HYPO 25GX1X1/2 BEV (NEEDLE) ×2 IMPLANT
NS IRRIG 1000ML POUR BTL (IV SOLUTION) ×2 IMPLANT
PACK TOTAL JOINT (CUSTOM PROCEDURE TRAY) ×2 IMPLANT
PAD ABD 8X10 STRL (GAUZE/BANDAGES/DRESSINGS) ×1 IMPLANT
PAD ARMBOARD 7.5X6 YLW CONV (MISCELLANEOUS) ×4 IMPLANT
PAD CAST 4YDX4 CTTN HI CHSV (CAST SUPPLIES) IMPLANT
PADDING CAST COTTON 4X4 STRL (CAST SUPPLIES) ×2
PADDING CAST COTTON 6X4 STRL (CAST SUPPLIES) IMPLANT
PLATE FIB EVOS 2.7/3.5 7H R103 (Plate) ×1 IMPLANT
SCREW CORT 2.7X14 T8 EVOS (Screw) ×1 IMPLANT
SCREW CORT 2.7X16 STAR T8 EVOS (Screw) ×2 IMPLANT
SCREW CORT 2.7X17 T8 ST EVOS (Screw) ×1 IMPLANT
SCREW CORT 2.7X18 T8 ST EVOS (Screw) ×1 IMPLANT
SCREW CORT 3.5X13MM (Screw) ×1 IMPLANT
SCREW CORT 3.5X44MM ST EVOS (Screw) ×1 IMPLANT
SCREW CORT EVOS ST 3.5X12 (Screw) ×2 IMPLANT
SCREW CORT ST EVOS 3.5X60 (Screw) ×1 IMPLANT
SCREW CORT ST EVOS 3.5X65 (Screw) ×1 IMPLANT
SCREW LOCK ST EVOS 3.5X12 (Screw) ×1 IMPLANT
SPONGE T-LAP 18X18 ~~LOC~~+RFID (SPONGE) IMPLANT
STAPLER VISISTAT 35W (STAPLE) ×2 IMPLANT
SUCTION FRAZIER HANDLE 10FR (MISCELLANEOUS) ×1
SUCTION TUBE FRAZIER 10FR DISP (MISCELLANEOUS) ×1 IMPLANT
SUT ETHILON 3 0 PS 1 (SUTURE) ×4 IMPLANT
SUT PROLENE 0 CT (SUTURE) IMPLANT
SUT VIC AB 0 CT1 27 (SUTURE) ×2
SUT VIC AB 0 CT1 27XBRD ANBCTR (SUTURE) ×1 IMPLANT
SUT VIC AB 2-0 CT1 27 (SUTURE) ×4
SUT VIC AB 2-0 CT1 TAPERPNT 27 (SUTURE) ×2 IMPLANT
SYR CONTROL 10ML LL (SYRINGE) ×2 IMPLANT
TOWEL GREEN STERILE (TOWEL DISPOSABLE) ×4 IMPLANT
TOWEL GREEN STERILE FF (TOWEL DISPOSABLE) ×2 IMPLANT
UNDERPAD 30X36 HEAVY ABSORB (UNDERPADS AND DIAPERS) ×2 IMPLANT
WATER STERILE IRR 1000ML POUR (IV SOLUTION) ×2 IMPLANT

## 2021-09-24 NOTE — Progress Notes (Signed)
Attempted to see pt but pt at surgery. Will check back as schedule allows. Please reorder OT services post surgery. Jinger Neighbors, OTR/L
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
## 2021-09-24 NOTE — Anesthesia Procedure Notes (Signed)
Anesthesia Regional Block: Popliteal block   Pre-Anesthetic Checklist: , timeout performed,  Correct Patient, Correct Site, Correct Laterality,  Correct Procedure, Correct Position, site marked,  Risks and benefits discussed,  Surgical consent,  Pre-op evaluation,  At surgeon's request and post-op pain management  Laterality: Right  Prep: chloraprep       Needles:  Injection technique: Single-shot  Needle Type: Echogenic Stimulator Needle     Needle Length: 5cm  Needle Gauge: 22     Additional Needles:   Procedures:, nerve stimulator,,, ultrasound used (permanent image in chart),,     Nerve Stimulator or Paresthesia:  Response: foot, 0.45 mA  Additional Responses:   Narrative:  Start time: 09/24/2021 7:25 AM End time: 09/24/2021 7:30 AM Injection made incrementally with aspirations every 5 mL.  Performed by: Personally  Anesthesiologist: Janeece Riggers, MD  Additional Notes: Functioning IV was confirmed and monitors were applied.  A 29mm 22ga Arrow echogenic stimulator needle was used. Sterile prep and drape,hand hygiene and sterile gloves were used. Ultrasound guidance: relevant anatomy identified, needle position confirmed, local anesthetic spread visualized around nerve(s)., vascular puncture avoided.  Image printed for medical record. Negative aspiration and negative test dose prior to incremental administration of local anesthetic. The patient tolerated the procedure well.

## 2021-09-24 NOTE — Progress Notes (Signed)
PT Cancellation Note  Patient Details Name: Katie Robertson MRN: [id number] DOB: 1990-10-11   Cancelled Treatment:    Reason Eval/Treat Not Completed: Patient at procedure or test/unavailable (surgery). Will check back at later time.   Lyanne Co, PT  Acute Rehab Services  Pager [phone] Office [phone]    Lawana Chambers Dominie Benedick 09/24/2021, 9:38 AM
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
## 2021-09-24 NOTE — Interval H&P Note (Signed)
History and Physical Interval Note:  09/24/2021 7:12 AM  Katie Robertson  has presented today for surgery, with the diagnosis of Right Ankle Fracture.  The various methods of treatment have been discussed with the patient and family. After consideration of risks, benefits and other options for treatment, the patient has consented to  Procedure(s): OPEN REDUCTION INTERNAL FIXATION (ORIF) ANKLE FRACTURE (Right) as a surgical intervention.  The patient's history has been reviewed, patient examined, no change in status, stable for surgery.  I have reviewed the patient's chart and labs.  Questions were answered to the patient's satisfaction.     Lennette Bihari P Naveyah Iacovelli

## 2021-09-24 NOTE — Anesthesia Procedure Notes (Signed)
Procedure Name: LMA Insertion Date/Time: 09/24/2021 7:51 AM Performed by: Katina Degree, CRNA Pre-anesthesia Checklist: Patient identified, Emergency Drugs available, Suction available and Patient being monitored Patient Re-evaluated:Patient Re-evaluated prior to induction Oxygen Delivery Method: Circle system utilized Preoxygenation: Pre-oxygenation with 100% oxygen Induction Type: IV induction Ventilation: Mask ventilation without difficulty LMA: LMA inserted LMA Size: 5.0 Laser Tube: Cuffed inflated with minimal occlusive pressure - saline Placement Confirmation: positive ETCO2 and breath sounds checked- equal and bilateral Tube secured with: Tape Dental Injury: Teeth and Oropharynx as per pre-operative assessment

## 2021-09-24 NOTE — Anesthesia Postprocedure Evaluation (Signed)
Anesthesia Post Note  Patient: Alesia Oshields  Procedure(s) Performed: OPEN REDUCTION INTERNAL FIXATION (ORIF) ANKLE FRACTURE (Right: Ankle)     Patient location during evaluation: PACU Anesthesia Type: Regional and General Level of consciousness: awake and alert Pain management: pain level controlled Vital Signs Assessment: post-procedure vital signs reviewed and stable Respiratory status: spontaneous breathing, nonlabored ventilation, respiratory function stable and patient connected to nasal cannula oxygen Cardiovascular status: blood pressure returned to baseline and stable Postop Assessment: no apparent nausea or vomiting Anesthetic complications: no   No notable events documented.  Last Vitals:  Vitals:   09/24/21 1005 09/24/21 1028  BP:  132/85  Pulse: 96 98  Resp: 14 14  Temp: (!) 36.1 C   SpO2: 95% 93%    Last Pain:  Vitals:   09/24/21 1152  TempSrc:   PainSc: 8                  Gerrett Loman

## 2021-09-24 NOTE — Transfer of Care (Signed)
Immediate Anesthesia Transfer of Care Note  Patient: Rosamary Boudreau  Procedure(s) Performed: OPEN REDUCTION INTERNAL FIXATION (ORIF) ANKLE FRACTURE (Right: Ankle)  Patient Location: PACU  Anesthesia Type:General  Level of Consciousness: drowsy  Airway & Oxygen Therapy: Patient Spontanous Breathing and Patient connected to face mask oxygen  Post-op Assessment: Report given to RN and Post -op Vital signs reviewed and stable  Post vital signs: Reviewed and stable  Last Vitals:  Vitals Value Taken Time  BP 135/91 09/24/21 0925  Temp    Pulse 92 09/24/21 0925  Resp 15 09/24/21 0925  SpO2 100 % 09/24/21 0925  Vitals shown include unvalidated device data.  Last Pain:  Vitals:   09/24/21 0600  TempSrc:   PainSc: 6          Complications: No notable events documented.
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
## 2021-09-25 ENCOUNTER — Encounter (HOSPITAL_COMMUNITY): Payer: Self-pay | Admitting: Student

## 2021-09-25 LAB — BASIC METABOLIC PANEL
Anion gap: 7 (ref 5–15)
BUN: 10 mg/dL (ref 6–20)
CO2: 26 mmol/L (ref 22–32)
Calcium: 8.7 mg/dL — ABNORMAL LOW (ref 8.9–10.3)
Chloride: 108 mmol/L (ref 98–111)
Creatinine, Ser: 0.58 mg/dL (ref 0.44–1.00)
GFR, Estimated: >60 mL/min (ref >60)
Glucose, Bld: 104 mg/dL — ABNORMAL HIGH (ref 70–99)
Potassium: 4.2 mmol/L (ref 3.5–5.1)
Sodium: 141 mmol/L (ref 135–145)

## 2021-09-25 LAB — CBC
HCT: 31.9 % — ABNORMAL LOW (ref 36.0–46.0)
Hemoglobin: 10.3 g/dL — ABNORMAL LOW (ref 12.0–15.0)
MCH: 27.4 pg (ref 26.0–34.0)
MCHC: 32.3 g/dL (ref 30.0–36.0)
MCV: 84.8 fL (ref 80.0–100.0)
Platelets: 372 10*3/uL (ref 150–400)
RBC: 3.76 MIL/uL — ABNORMAL LOW (ref 3.87–5.11)
RDW: 13 % (ref 11.5–15.5)
WBC: 15.6 10*3/uL — ABNORMAL HIGH (ref 4.0–10.5)
nRBC: 0 % (ref 0.0–0.2)

## 2021-09-25 MED ORDER — OXYCODONE HCL 5 MG PO TABS: 5.0000 mg | ORAL_TABLET | ORAL | Status: DC | PRN

## 2021-09-25 MED ORDER — HYDROMORPHONE HCL 1 MG/ML IJ SOLN
1.0000 mg | Freq: Once | INTRAMUSCULAR | Status: AC
  Administered 2021-09-25: 1 mg via INTRAVENOUS
  Filled 2021-09-25: qty 1

## 2021-09-25 MED ORDER — VITAMIN D 25 MCG (1000 UNIT) PO TABS
2000.0000 [IU] | ORAL_TABLET | Freq: Every day | ORAL | Status: DC
  Administered 2021-09-25 – 2021-09-26 (×2): 2000 [IU] via ORAL
  Filled 2021-09-25 (×2): qty 2

## 2021-09-25 MED ORDER — BISACODYL 10 MG RE SUPP
10.0000 mg | Freq: Once | RECTAL | Status: AC
  Administered 2021-09-25: 10 mg via RECTAL
  Filled 2021-09-25: qty 1

## 2021-09-25 MED ORDER — METHOCARBAMOL 500 MG PO TABS
1000.0000 mg | ORAL_TABLET | Freq: Three times a day (TID) | ORAL | Status: DC
  Administered 2021-09-25 – 2021-09-26 (×3): 1000 mg via ORAL
  Filled 2021-09-25 (×3): qty 2

## 2021-09-25 MED ORDER — POLYETHYLENE GLYCOL 3350 17 G PO PACK
17.0000 g | PACK | Freq: Every day | ORAL | Status: DC
  Administered 2021-09-25 – 2021-09-26 (×2): 17 g via ORAL
  Filled 2021-09-25 (×2): qty 1

## 2021-09-25 MED ORDER — SENNA 8.6 MG PO TABS
1.0000 | ORAL_TABLET | Freq: Every day | ORAL | Status: DC
  Administered 2021-09-25 – 2021-09-26 (×2): 8.6 mg via ORAL
  Filled 2021-09-25 (×2): qty 1

## 2021-09-25 MED ORDER — KETOROLAC TROMETHAMINE 15 MG/ML IJ SOLN
30.0000 mg | Freq: Four times a day (QID) | INTRAMUSCULAR | Status: AC
  Administered 2021-09-25 – 2021-09-26 (×5): 30 mg via INTRAVENOUS
  Filled 2021-09-25 (×5): qty 2

## 2021-09-25 MED ORDER — OXYCODONE HCL 5 MG PO TABS: 10.0000 mg | ORAL_TABLET | ORAL | Status: DC | PRN

## 2021-09-25 MED ORDER — OXYCODONE HCL 5 MG PO TABS
10.0000 mg | ORAL_TABLET | ORAL | Status: DC | PRN
  Administered 2021-09-25 – 2021-09-26 (×4): 15 mg via ORAL
  Filled 2021-09-25 (×4): qty 3

## 2021-09-25 MED ORDER — KETOROLAC TROMETHAMINE 15 MG/ML IJ SOLN: 15.0000 mg | Freq: Four times a day (QID) | INTRAMUSCULAR | Status: DC

## 2021-09-25 MED ORDER — MAGNESIUM HYDROXIDE 400 MG/5ML PO SUSP
30.0000 mL | Freq: Once | ORAL | Status: AC
  Administered 2021-09-25: 30 mL via ORAL
  Filled 2021-09-25: qty 30

## 2021-09-25 MED ORDER — CEFDINIR 300 MG PO CAPS
300.0000 mg | ORAL_CAPSULE | Freq: Two times a day (BID) | ORAL | Status: DC
  Administered 2021-09-25 – 2021-09-26 (×3): 300 mg via ORAL
  Filled 2021-09-25 (×4): qty 1

## 2021-09-25 NOTE — Progress Notes (Signed)
Orthopaedic Trauma Progress Note  SUBJECTIVE: Reports constant, moderate, severe pain in right ankle.  Regional nerve block is starting to wear off.  I will adjust medications to assist with pain.  No chest pain. No SOB. No nausea/vomiting. No other complaints.   OBJECTIVE:  Vitals:   09/25/21 0724 09/25/21 0759  BP: (!) 138/100 133/85  Pulse: (!) 103   Resp: 20   Temp: 98 F (36.7 C)   SpO2: 99%     General: Laying in bed, intermittently tearful.  Appears in pain. Respiratory: No increased work of breathing.  Right lower extremity: Well-padded, well fitting short leg splint in place.  Knee immobilizer in place.  Able to wiggle toes a small amount.  Endorses feeling of pressure over the toes with light touch, has not regained full sensory function.  Nontender above splint.  Toes warm and well-perfused Left lower extremity: Skin warm and dry.  Ankle DF/PF intact.  Neurovascularly intact  IMAGING: Stable post op imaging.   LABS:  Results for orders placed or performed during the hospital encounter of 09/21/21 (from the past 24 hour(s))  CBC     Status: Abnormal   Collection Time: 09/25/21  3:43 AM  Result Value Ref Range   WBC 15.6 (H) 4.0 - 10.5 K/uL   RBC 3.76 (L) 3.87 - 5.11 MIL/uL   Hemoglobin 10.3 (L) 12.0 - 15.0 g/dL   HCT 85.4 (L) 62.7 - 03.5 %   MCV 84.8 80.0 - 100.0 fL   MCH 27.4 26.0 - 34.0 pg   MCHC 32.3 30.0 - 36.0 g/dL   RDW 00.9 38.1 - 82.9 %   Platelets 372 150 - 400 K/uL   nRBC 0.0 0.0 - 0.2 %  Basic metabolic panel     Status: Abnormal   Collection Time: 09/25/21  3:43 AM  Result Value Ref Range   Sodium 141 135 - 145 mmol/L   Potassium 4.2 3.5 - 5.1 mmol/L   Chloride 108 98 - 111 mmol/L   CO2 26 22 - 32 mmol/L   Glucose, Bld 104 (H) 70 - 99 mg/dL   BUN 10 6 - 20 mg/dL   Creatinine, Ser 9.37 0.44 - 1.00 mg/dL   Calcium 8.7 (L) 8.9 - 10.3 mg/dL   GFR, Estimated >16 >96 mL/min   Anion gap 7 5 - 15    ASSESSMENT: Katie Robertson is a 31 y.o. female, 1  Day Post-Op s/p ORIF RIGHT ANKLE FRACTURE NONOPERATIVE MANAGEMENT LEFT TIBIAL EMINENCE FRACTURE  CV/Blood loss: Acute blood loss anemia, Hgb 10.3 this morning.  Tachycardic, likely related to pain  PLAN: Weightbearing: NWB RLE, WBAT LLE ROM: - RLE: Okay for unrestricted knee motion as tolerated - LLE: Okay for unrestricted knee motion as tolerated Incisional and dressing care: Dressings left intact until follow-up  Showering: Okay to shower, keep RLE splint dry Orthopedic device(s): Splint RLE. Knee immobilizer RLE for comfort only, no bracing required.   Pain management:  1. Tylenol 1000 mg q 6 hours scheduled 2. Robaxin 1000 mg q 8 hours PRN 3. Oxycodone 5-15 mg q 4 hours PRN 4. Dilaudid 1 mg q 2 hours PRN 5. Neurontin 300 mg TID 6. Toradol 15 mg q 6 hours x 5 doses VTE prophylaxis: Lovenox, SCDs ID:  Ancef 2gm post op Foley/Lines:  No foley, KVO IVFs Impediments to Fracture Healing: Vitamin D level 17, start on D3 supplementation Dispo: PT/OT evaluation today.  Work on pain control throughout the day today.    D/C recommendations: -  Aspirin 325 mg twice daily x30 days for DVT prophylaxis - Continue Vit D3 supplementation 2000 units daily x90 days  Follow - up plan: 2 weeks   Contact information:  Truitt Merle MD, Thyra Breed PA-C. After hours and holidays please check [email or web address] for group call information for [facility]   Thompson Caul, PA-C [phone] (office) [email or web address]
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
## 2021-09-25 NOTE — Progress Notes (Signed)
Inpatient Rehab Admissions Coordinator:   At this time we are recommended a CIR consult and I will place an order per our protocol.  Estill Dooms, PT, DPT Admissions Coordinator [phone] 09/25/21  2:50 PM

## 2021-09-26 ENCOUNTER — Encounter (HOSPITAL_COMMUNITY): Payer: Self-pay | Admitting: General Surgery

## 2021-09-26 ENCOUNTER — Encounter (HOSPITAL_COMMUNITY): Payer: Self-pay | Admitting: Physical Medicine and Rehabilitation

## 2021-09-26 ENCOUNTER — Inpatient Hospital Stay (HOSPITAL_COMMUNITY)
Admission: AD | Admit: 2021-09-26 | Discharge: 2021-10-09 | DRG: 560 | Disposition: A | Discharge status: Home / Self Care | Payer: Medicaid Other | Source: Intra-hospital | Attending: Physical Medicine and Rehabilitation | Admitting: Physical Medicine and Rehabilitation

## 2021-09-26 ENCOUNTER — Other Ambulatory Visit: Payer: Self-pay

## 2021-09-26 DIAGNOSIS — Z833 Family history of diabetes mellitus: Secondary | ICD-10-CM | POA: Diagnosis not present

## 2021-09-26 DIAGNOSIS — E559 Vitamin D deficiency, unspecified: Secondary | ICD-10-CM | POA: Diagnosis present

## 2021-09-26 DIAGNOSIS — S82145D Nondisplaced bicondylar fracture of left tibia, subsequent encounter for closed fracture with routine healing: Secondary | ICD-10-CM | POA: Diagnosis not present

## 2021-09-26 DIAGNOSIS — S2243XD Multiple fractures of ribs, bilateral, subsequent encounter for fracture with routine healing: Secondary | ICD-10-CM

## 2021-09-26 DIAGNOSIS — Z741 Need for assistance with personal care: Secondary | ICD-10-CM | POA: Diagnosis present

## 2021-09-26 DIAGNOSIS — S2220XD Unspecified fracture of sternum, subsequent encounter for fracture with routine healing: Secondary | ICD-10-CM | POA: Diagnosis not present

## 2021-09-26 DIAGNOSIS — N12 Tubulo-interstitial nephritis, not specified as acute or chronic: Secondary | ICD-10-CM | POA: Diagnosis present

## 2021-09-26 DIAGNOSIS — F419 Anxiety disorder, unspecified: Secondary | ICD-10-CM | POA: Diagnosis present

## 2021-09-26 DIAGNOSIS — S82001D Unspecified fracture of right patella, subsequent encounter for closed fracture with routine healing: Secondary | ICD-10-CM

## 2021-09-26 DIAGNOSIS — R411 Anterograde amnesia: Secondary | ICD-10-CM | POA: Diagnosis present

## 2021-09-26 DIAGNOSIS — S022XXD Fracture of nasal bones, subsequent encounter for fracture with routine healing: Secondary | ICD-10-CM | POA: Diagnosis not present

## 2021-09-26 DIAGNOSIS — Z87442 Personal history of urinary calculi: Secondary | ICD-10-CM

## 2021-09-26 DIAGNOSIS — S82141D Displaced bicondylar fracture of right tibia, subsequent encounter for closed fracture with routine healing: Principal | ICD-10-CM

## 2021-09-26 DIAGNOSIS — S82401A Unspecified fracture of shaft of right fibula, initial encounter for closed fracture: Secondary | ICD-10-CM | POA: Diagnosis present

## 2021-09-26 DIAGNOSIS — S82851D Displaced trimalleolar fracture of right lower leg, subsequent encounter for closed fracture with routine healing: Secondary | ICD-10-CM | POA: Diagnosis not present

## 2021-09-26 DIAGNOSIS — R000 Tachycardia, unspecified: Secondary | ICD-10-CM | POA: Diagnosis not present

## 2021-09-26 DIAGNOSIS — G629 Polyneuropathy, unspecified: Secondary | ICD-10-CM

## 2021-09-26 DIAGNOSIS — F17210 Nicotine dependence, cigarettes, uncomplicated: Secondary | ICD-10-CM | POA: Diagnosis present

## 2021-09-26 DIAGNOSIS — S27322D Contusion of lung, bilateral, subsequent encounter: Secondary | ICD-10-CM

## 2021-09-26 DIAGNOSIS — D62 Acute posthemorrhagic anemia: Secondary | ICD-10-CM | POA: Diagnosis present

## 2021-09-26 DIAGNOSIS — R52 Pain, unspecified: Secondary | ICD-10-CM

## 2021-09-26 DIAGNOSIS — S82131A Displaced fracture of medial condyle of right tibia, initial encounter for closed fracture: Secondary | ICD-10-CM

## 2021-09-26 DIAGNOSIS — G4700 Insomnia, unspecified: Secondary | ICD-10-CM

## 2021-09-26 DIAGNOSIS — T1490XA Injury, unspecified, initial encounter: Secondary | ICD-10-CM | POA: Diagnosis not present

## 2021-09-26 DIAGNOSIS — K5900 Constipation, unspecified: Secondary | ICD-10-CM | POA: Diagnosis present

## 2021-09-26 DIAGNOSIS — S82201A Unspecified fracture of shaft of right tibia, initial encounter for closed fracture: Secondary | ICD-10-CM | POA: Diagnosis present

## 2021-09-26 DIAGNOSIS — S93431D Sprain of tibiofibular ligament of right ankle, subsequent encounter: Secondary | ICD-10-CM | POA: Diagnosis not present

## 2021-09-26 DIAGNOSIS — R519 Headache, unspecified: Secondary | ICD-10-CM | POA: Diagnosis not present

## 2021-09-26 DIAGNOSIS — T148XXA Other injury of unspecified body region, initial encounter: Secondary | ICD-10-CM

## 2021-09-26 DIAGNOSIS — R202 Paresthesia of skin: Secondary | ICD-10-CM | POA: Diagnosis not present

## 2021-09-26 DIAGNOSIS — Z8249 Family history of ischemic heart disease and other diseases of the circulatory system: Secondary | ICD-10-CM

## 2021-09-26 LAB — CBC
HCT: 31.2 % — ABNORMAL LOW (ref 36.0–46.0)
Hemoglobin: 10 g/dL — ABNORMAL LOW (ref 12.0–15.0)
MCH: 27.4 pg (ref 26.0–34.0)
MCHC: 32.1 g/dL (ref 30.0–36.0)
MCV: 85.5 fL (ref 80.0–100.0)
Platelets: 331 10*3/uL (ref 150–400)
RBC: 3.65 MIL/uL — ABNORMAL LOW (ref 3.87–5.11)
RDW: 13.2 % (ref 11.5–15.5)
WBC: 8.5 10*3/uL (ref 4.0–10.5)
nRBC: 0 % (ref 0.0–0.2)

## 2021-09-26 LAB — BASIC METABOLIC PANEL
Anion gap: 7 (ref 5–15)
BUN: 10 mg/dL (ref 6–20)
CO2: 25 mmol/L (ref 22–32)
Calcium: 8.5 mg/dL — ABNORMAL LOW (ref 8.9–10.3)
Chloride: 109 mmol/L (ref 98–111)
Creatinine, Ser: 0.65 mg/dL (ref 0.44–1.00)
GFR, Estimated: >60 mL/min (ref >60)
Glucose, Bld: 89 mg/dL (ref 70–99)
Potassium: 4.1 mmol/L (ref 3.5–5.1)
Sodium: 141 mmol/L (ref 135–145)

## 2021-09-26 MED ORDER — NICOTINE 7 MG/24HR TD PT24
7.0000 mg | MEDICATED_PATCH | Freq: Every day | TRANSDERMAL | Status: DC
  Administered 2021-09-27 – 2021-10-06 (×10): 7 mg via TRANSDERMAL
  Filled 2021-09-26 (×14): qty 1

## 2021-09-26 MED ORDER — ACETAMINOPHEN 325 MG PO TABS
650.0000 mg | ORAL_TABLET | Freq: Three times a day (TID) | ORAL | Status: DC
  Administered 2021-09-26 – 2021-10-09 (×51): 650 mg via ORAL
  Filled 2021-09-26 (×52): qty 2

## 2021-09-26 MED ORDER — GUAIFENESIN-DM 100-10 MG/5ML PO SYRP: 5.0000 mL | ORAL_SOLUTION | Freq: Four times a day (QID) | ORAL | Status: DC | PRN

## 2021-09-26 MED ORDER — HYDROMORPHONE HCL 1 MG/ML IJ SOLN
1.0000 mg | Freq: Four times a day (QID) | INTRAMUSCULAR | Status: DC | PRN
  Administered 2021-09-26: 1 mg via INTRAVENOUS
  Filled 2021-09-26: qty 1

## 2021-09-26 MED ORDER — PROCHLORPERAZINE 25 MG RE SUPP: 12.5000 mg | Freq: Four times a day (QID) | RECTAL | Status: DC | PRN

## 2021-09-26 MED ORDER — TRAZODONE HCL 50 MG PO TABS
25.0000 mg | ORAL_TABLET | Freq: Every evening | ORAL | Status: DC | PRN
  Administered 2021-09-28 – 2021-10-08 (×11): 50 mg via ORAL
  Filled 2021-09-26 (×11): qty 1

## 2021-09-26 MED ORDER — TRAMADOL HCL 50 MG PO TABS
50.0000 mg | ORAL_TABLET | Freq: Four times a day (QID) | ORAL | Status: DC
  Administered 2021-09-26: 50 mg via ORAL
  Filled 2021-09-26: qty 1

## 2021-09-26 MED ORDER — DIPHENHYDRAMINE HCL 12.5 MG/5ML PO ELIX: 12.5000 mg | ORAL_SOLUTION | Freq: Four times a day (QID) | ORAL | Status: DC | PRN

## 2021-09-26 MED ORDER — LIDOCAINE 5 % EX PTCH
1.0000 | MEDICATED_PATCH | CUTANEOUS | Status: DC
  Administered 2021-09-26 – 2021-10-08 (×8): 1 via TRANSDERMAL
  Filled 2021-09-26 (×10): qty 1

## 2021-09-26 MED ORDER — ENOXAPARIN SODIUM 30 MG/0.3ML IJ SOSY: 30.0000 mg | PREFILLED_SYRINGE | Freq: Two times a day (BID) | INTRAMUSCULAR | Status: DC

## 2021-09-26 MED ORDER — CEFDINIR 300 MG PO CAPS
300.0000 mg | ORAL_CAPSULE | Freq: Two times a day (BID) | ORAL | Status: AC
  Administered 2021-09-26 – 2021-09-28 (×5): 300 mg via ORAL
  Filled 2021-09-26 (×5): qty 1

## 2021-09-26 MED ORDER — BISACODYL 10 MG RE SUPP
10.0000 mg | Freq: Every day | RECTAL | Status: DC | PRN
  Administered 2021-09-30: 10 mg via RECTAL
  Filled 2021-09-26: qty 1

## 2021-09-26 MED ORDER — ENOXAPARIN SODIUM 30 MG/0.3ML IJ SOSY
30.0000 mg | PREFILLED_SYRINGE | Freq: Two times a day (BID) | INTRAMUSCULAR | Status: DC
  Administered 2021-09-26 – 2021-10-09 (×26): 30 mg via SUBCUTANEOUS
  Filled 2021-09-26 (×26): qty 0.3

## 2021-09-26 MED ORDER — SENNOSIDES-DOCUSATE SODIUM 8.6-50 MG PO TABS
2.0000 | ORAL_TABLET | Freq: Every day | ORAL | Status: DC
  Administered 2021-09-27 – 2021-10-09 (×12): 2 via ORAL
  Filled 2021-09-26 (×12): qty 2

## 2021-09-26 MED ORDER — NICOTINE 7 MG/24HR TD PT24
7.0000 mg | MEDICATED_PATCH | Freq: Every day | TRANSDERMAL | Status: DC
  Administered 2021-09-26: 7 mg via TRANSDERMAL
  Filled 2021-09-26: qty 1

## 2021-09-26 MED ORDER — PROCHLORPERAZINE EDISYLATE 10 MG/2ML IJ SOLN
5.0000 mg | Freq: Four times a day (QID) | INTRAMUSCULAR | Status: DC | PRN
  Administered 2021-10-07: 10 mg via INTRAMUSCULAR
  Filled 2021-09-26: qty 2

## 2021-09-26 MED ORDER — POLYETHYLENE GLYCOL 3350 17 G PO PACK
17.0000 g | PACK | Freq: Every day | ORAL | Status: DC
  Administered 2021-09-27 – 2021-10-06 (×9): 17 g via ORAL
  Filled 2021-09-26 (×13): qty 1

## 2021-09-26 MED ORDER — POLYETHYLENE GLYCOL 3350 17 G PO PACK: 17.0000 g | PACK | Freq: Every day | ORAL | Status: DC | PRN

## 2021-09-26 MED ORDER — ALUM & MAG HYDROXIDE-SIMETH 200-200-20 MG/5ML PO SUSP: 30.0000 mL | ORAL | Status: DC | PRN

## 2021-09-26 MED ORDER — FENTANYL 12 MCG/HR TD PT72: 1.0000 | MEDICATED_PATCH | TRANSDERMAL | Status: DC

## 2021-09-26 MED ORDER — OXYCODONE HCL 5 MG PO TABS
15.0000 mg | ORAL_TABLET | ORAL | Status: DC | PRN
Start: 2021-09-26 — End: 2021-10-10
  Administered 2021-09-26 – 2021-10-09 (×25): 15 mg via ORAL
  Filled 2021-09-26 (×26): qty 3

## 2021-09-26 MED ORDER — FLEET ENEMA 7-19 GM/118ML RE ENEM: 1.0000 | ENEMA | Freq: Once | RECTAL | Status: DC | PRN

## 2021-09-26 MED ORDER — QUETIAPINE FUMARATE 50 MG PO TABS
50.0000 mg | ORAL_TABLET | Freq: Every day | ORAL | Status: DC
  Administered 2021-09-26 – 2021-10-08 (×13): 50 mg via ORAL
  Filled 2021-09-26 (×13): qty 1

## 2021-09-26 MED ORDER — ACETAMINOPHEN 325 MG PO TABS
325.0000 mg | ORAL_TABLET | ORAL | Status: DC | PRN
  Filled 2021-09-26: qty 2

## 2021-09-26 MED ORDER — SALINE SPRAY 0.65 % NA SOLN
1.0000 | Freq: Three times a day (TID) | NASAL | Status: DC
  Administered 2021-09-26 – 2021-10-08 (×35): 1 via NASAL

## 2021-09-26 MED ORDER — METHOCARBAMOL 500 MG PO TABS
1000.0000 mg | ORAL_TABLET | Freq: Three times a day (TID) | ORAL | Status: DC
  Administered 2021-09-26 – 2021-10-09 (×40): 1000 mg via ORAL
  Filled 2021-09-26 (×40): qty 2

## 2021-09-26 MED ORDER — OXYCODONE HCL ER 15 MG PO T12A
15.0000 mg | EXTENDED_RELEASE_TABLET | Freq: Two times a day (BID) | ORAL | Status: DC
  Administered 2021-09-26 – 2021-09-28 (×3): 15 mg via ORAL
  Filled 2021-09-26 (×5): qty 1

## 2021-09-26 MED ORDER — PROCHLORPERAZINE MALEATE 5 MG PO TABS
5.0000 mg | ORAL_TABLET | Freq: Four times a day (QID) | ORAL | Status: DC | PRN
  Filled 2021-09-26: qty 2

## 2021-09-26 MED ORDER — GABAPENTIN 300 MG PO CAPS
300.0000 mg | ORAL_CAPSULE | Freq: Three times a day (TID) | ORAL | Status: DC
  Administered 2021-09-26 – 2021-09-28 (×6): 300 mg via ORAL
  Filled 2021-09-26 (×6): qty 1

## 2021-09-26 MED ORDER — VITAMIN D 25 MCG (1000 UNIT) PO TABS
2000.0000 [IU] | ORAL_TABLET | Freq: Every day | ORAL | Status: DC
  Administered 2021-09-27 – 2021-10-09 (×12): 2000 [IU] via ORAL
  Filled 2021-09-26 (×13): qty 2

## 2021-09-26 MED ORDER — TRAMADOL HCL 50 MG PO TABS
50.0000 mg | ORAL_TABLET | Freq: Four times a day (QID) | ORAL | Status: DC
  Administered 2021-09-26 – 2021-10-09 (×49): 50 mg via ORAL
  Filled 2021-09-26 (×52): qty 1
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
## 2021-09-26 NOTE — Progress Notes (Signed)
Inpatient Rehab Admissions Coordinator:   I have a bed for this Pt. And can admit to CIR today. RN may call report to [phone].  Kylie Gros, MS, CCC-SLP Rehab Admissions Coordinator  [phone] (celll) [phone] (office)  

## 2021-09-26 NOTE — Progress Notes (Signed)
Patient ID: Katie Robertson, female   DOB: 09-12-1990, 31 y.o.   MRN: [id number]  Met with patient in room, introduced myself and explained my role in her care. Patient resting in bed with report of 7/10 left rib pain. This RN gave PRN Oxy IR, 15 mg as per order. Patient also requested to have Lidocaine patch for rib pain. This RN notified Algis Liming, PA-C of request. Notified patient nurse of medication administration and Lidocaine patch request. This RN also turned the thermostat temperature to 68 per patient request. I will continue to monitor patient progress on CIR.  Dorthula Nettles, RN3, BSN, CBIS, [place], [facility], Inpatient Rehabilitation Office [phone] Cell [phone]
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
## 2021-09-26 NOTE — TOC Transition Note (Signed)
Transition of Care [facility]) - CM/SW Discharge Note   Patient Details  Name: Katie Robertson MRN: [id number] Date of Birth: 17-Jan-1991  [facility] CM/SW Contact:  Glennon Mac, RN Phone Number: 09/26/2021, 12:35 PM   Clinical Narrative:    Patient medically stable for discharge today, and bed available on rehab unit.  Plan discharge to [facility] when bed ready.     Final next level of care: IP Rehab Facility Barriers to Discharge: Barriers Resolved   Patient Goals and CMS Choice Patient states their goals for this hospitalization and ongoing recovery are:: to go home CMS Medicare.gov Compare Post Acute Care list provided to:: Patient Choice offered to / list presented to : Patient                        Discharge Plan and Services   Discharge Planning Services: CM Consult Post Acute Care Choice: IP Rehab                               Social Determinants of Health (SDOH) Interventions     Readmission Risk Interventions No flowsheet data found.  Quintella Baton, RN, BSN  Trauma/Neuro ICU Case Manager [phone]

## 2021-09-26 NOTE — Progress Notes (Signed)
Inpatient Rehabilitation Admission Medication Review by a Pharmacist  A complete drug regimen review was completed for this patient to identify any potential clinically significant medication issues.  High Risk Drug Classes Is patient taking? Indication by Medication  Antipsychotic Yes Compazine for N/V, seroquel - psychosis  Anticoagulant Yes Lovenox for DVT px  Antibiotic Yes Cefdinir for pyelo  Opioid Yes Oxycodone/tramadol for pain  Antiplatelet No   Hypoglycemics/insulin No   Vasoactive Medication No   Chemotherapy No   Other Yes Robaxin - spasms     Type of Medication Issue Identified Description of Issue Recommendation(s)  Drug Interaction(s) (clinically significant)     Duplicate Therapy     Allergy     No Medication Administration End Date     Incorrect Dose     Additional Drug Therapy Needed     Significant med changes from prior encounter (inform family/care partners about these prior to discharge).    Other       Clinically significant medication issues were identified that warrant physician communication and completion of prescribed/recommended actions by midnight of the next day:  No  Name of provider notified for urgent issues identified:   Provider Method of Notification:     Pharmacist comments:   Time spent performing this drug regimen review (minutes):  [address] PharmD, [place], AAHIVP, CPP Infectious Disease Pharmacist 09/26/2021 1:34 PM
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
## 2021-09-27 DIAGNOSIS — T1490XA Injury, unspecified, initial encounter: Secondary | ICD-10-CM | POA: Diagnosis not present

## 2021-09-27 LAB — COMPREHENSIVE METABOLIC PANEL
ALT: 18 U/L (ref 0–44)
AST: 25 U/L (ref 15–41)
Albumin: 3.1 g/dL — ABNORMAL LOW (ref 3.5–5.0)
Alkaline Phosphatase: 53 U/L (ref 38–126)
Anion gap: 10 (ref 5–15)
BUN: 11 mg/dL (ref 6–20)
CO2: 26 mmol/L (ref 22–32)
Calcium: 9.2 mg/dL (ref 8.9–10.3)
Chloride: 106 mmol/L (ref 98–111)
Creatinine, Ser: 0.6 mg/dL (ref 0.44–1.00)
GFR, Estimated: >60 mL/min (ref >60)
Glucose, Bld: 97 mg/dL (ref 70–99)
Potassium: 3.9 mmol/L (ref 3.5–5.1)
Sodium: 142 mmol/L (ref 135–145)
Total Bilirubin: 0.4 mg/dL (ref 0.3–1.2)
Total Protein: 6.3 g/dL — ABNORMAL LOW (ref 6.5–8.1)

## 2021-09-27 LAB — IRON AND TIBC
Iron: 37 ug/dL (ref 28–170)
Saturation Ratios: 14 % (ref 10.4–31.8)
TIBC: 259 ug/dL (ref 250–450)
UIBC: 222 ug/dL

## 2021-09-27 LAB — CBC WITH DIFFERENTIAL/PLATELET
Abs Immature Granulocytes: 0.09 10*3/uL — ABNORMAL HIGH (ref 0.00–0.07)
Basophils Absolute: 0 10*3/uL (ref 0.0–0.1)
Basophils Relative: 0 %
Eosinophils Absolute: 0.2 10*3/uL (ref 0.0–0.5)
Eosinophils Relative: 2 %
HCT: 32 % — ABNORMAL LOW (ref 36.0–46.0)
Hemoglobin: 10.5 g/dL — ABNORMAL LOW (ref 12.0–15.0)
Immature Granulocytes: 1 %
Lymphocytes Relative: 17 %
Lymphs Abs: 1.9 10*3/uL (ref 0.7–4.0)
MCH: 27.9 pg (ref 26.0–34.0)
MCHC: 32.8 g/dL (ref 30.0–36.0)
MCV: 84.9 fL (ref 80.0–100.0)
Monocytes Absolute: 0.6 10*3/uL (ref 0.1–1.0)
Monocytes Relative: 5 %
Neutro Abs: 8.5 10*3/uL — ABNORMAL HIGH (ref 1.7–7.7)
Neutrophils Relative %: 75 %
Platelets: 343 10*3/uL (ref 150–400)
RBC: 3.77 MIL/uL — ABNORMAL LOW (ref 3.87–5.11)
RDW: 13.2 % (ref 11.5–15.5)
WBC: 11.3 10*3/uL — ABNORMAL HIGH (ref 4.0–10.5)
nRBC: 0 % (ref 0.0–0.2)

## 2021-09-27 LAB — VITAMIN D 25 HYDROXY (VIT D DEFICIENCY, FRACTURES): Vit D, 25-Hydroxy: 20.71 ng/mL — ABNORMAL LOW (ref 30–100)

## 2021-09-27 LAB — MAGNESIUM: Magnesium: 2.1 mg/dL (ref 1.7–2.4)

## 2021-09-27 MED ORDER — VITAMIN D (ERGOCALCIFEROL) 1.25 MG (50000 UNIT) PO CAPS
50000.0000 [IU] | ORAL_CAPSULE | ORAL | Status: DC
Start: 2021-09-27 — End: 2021-10-10
  Administered 2021-09-27 – 2021-10-04 (×2): 50000 [IU] via ORAL
  Filled 2021-09-27 (×2): qty 1

## 2021-09-27 MED ORDER — LIDOCAINE 5 % EX PTCH
1.0000 | MEDICATED_PATCH | CUTANEOUS | Status: DC
  Administered 2021-09-27 – 2021-10-09 (×8): 1 via TRANSDERMAL
  Filled 2021-09-27 (×12): qty 1

## 2021-09-27 NOTE — Progress Notes (Signed)
[facility] Individual Statement of Services  Patient Name:  Katie Robertson  Date:  09/27/2021  Welcome to the [facility].  Our goal is to provide you with an individualized program based on your diagnosis and situation, designed to meet your specific needs.  With this comprehensive rehabilitation program, you will be expected to participate in at least 3 hours of rehabilitation therapies Monday-Friday, with modified therapy programming on the weekends.  Your rehabilitation program will include the following services:  Physical Therapy (PT), Occupational Therapy (OT), Speech Therapy (ST), 24 hour per day rehabilitation nursing, Therapeutic Recreaction (TR), Neuropsychology, Care Coordinator, Rehabilitation Medicine, Nutrition Services, Pharmacy Services, and Other  Weekly team conferences will be held on Wednesdays to discuss your progress.  Your Inpatient Rehabilitation Care Coordinator will talk with you frequently to get your input and to update you on team discussions.  Team conferences with you and your family in attendance may also be held.  Expected length of stay:  12-14 Days  Overall anticipated outcome:  Supervision  Depending on your progress and recovery, your program may change. Your Inpatient Rehabilitation Care Coordinator will coordinate services and will keep you informed of any changes. Your Inpatient Rehabilitation Care Coordinator's name and contact numbers are listed  below.  The following services may also be recommended but are not provided by the [facility]:   [place] will be made to provide these services after discharge if needed.  Arrangements include referral to agencies that provide these services.  Your insurance has been verified to be:   Medicaid of [place] Your primary doctor is:  NO PCP  Pertinent information will be  shared with your doctor and your insurance company.  Inpatient Rehabilitation Care Coordinator:  Erlene Quan, [place] or [phone]  Information discussed with and copy given to patient by: Dyanne Iha, 09/27/2021, 11:01 AM

## 2021-09-27 NOTE — Progress Notes (Signed)
Inpatient Rehabilitation  Patient information reviewed and entered into eRehab system by Naven Giambalvo M. Geremiah Fussell, M.A., CCC/SLP, PPS Coordinator.  Information including medical coding, functional ability and quality indicators will be reviewed and updated through discharge.    
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
## 2021-09-27 NOTE — Progress Notes (Signed)
Occupational Therapy Session Note  Patient Details  Name: Katie Robertson MRN: [id number] Date of Birth: [birth year]  Today's Date: 09/27/2021 OT Individual Time: 5277-8242 OT Individual Time Calculation (min): 75 min    Short Term Goals: Week 1:  OT Short Term Goal 1 (Week 1): LTG=STG 2.2 ELOS  Skilled Therapeutic Interventions/Progress Updates:  Pt greeted supine in bed agreeable to OT intervention. Session focus on BADL reeducation, functional mobility, dynamic standing balance and decreasing overall caregiver burden. Pt completed supine>sit with CGA with increased time and effort. Initial MOD A to stand from EOB with RW, MIN A to pivot from EOB>w/c. Pt completed ~ 20 ft of w/c propulsion with supervision but then reports fatigue needing total A for remainder of distance. Pt transported to vending machine to retrieve snacks then transported to gym. Pt completed x5 sit<>stands from w/c with MIN A. Pt completed therapeutic activity of challenging dynamic balance for ADL participation with horse shoe activity.  Pt sit<>stand from w/c with MIN A with pt instructed to reach out of BOS with RUE to toss horseshoes to target, pt completed x2 trials with overall CGA. Pt transported to apt to discuss shower transfer, pt to DC to walk in shower initially. Asked pt to take photo of shower to determine appropriate DME, at this point likely will need TTB so pt doesn't have to hop over threshold however pt preferred just the shower seat, will continue to discuss with OTR. Pt completed stand pivot transfer from w/c<>EOB with RW and MIN A. Pt completed sit<>supine via log roll technique with CGA. Pt transported back to room with total A where remainder of session focus on de-tangling hair from sink. Pt completed stand pivot back to bed with Rw and MIN A. pt left supine in bed with bed alarm activated and all needs within reach.                      Therapy Documentation Precautions:  Precautions Precautions:  Fall Precaution Booklet Issued: Yes (comment) Precaution Comments: per PA, [name], no sternal precautions Required Braces or Orthoses: Knee Immobilizer - Right Knee Immobilizer - Right: Other (comment) (for comfort) Restrictions Weight Bearing Restrictions: Yes RLE Weight Bearing: Non weight bearing LLE Weight Bearing: Weight bearing as tolerated  Pain: unrated pain in chest, rest breaks and pain meds provided, also discussed using pillow to brace chest during transitions.     Therapy/Group: Individual Therapy  Barron Schmid 09/27/2021, 3:33 PM
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
## 2021-09-27 NOTE — Plan of Care (Signed)
°  Problem: RH Balance Goal: LTG Patient will maintain dynamic standing with ADLs (OT) Description: LTG:  Patient will maintain dynamic standing balance with assist during activities of daily living (OT)  Flowsheets (Taken 09/27/2021 0903) LTG: Pt will maintain dynamic standing balance during ADLs with: Independent with assistive device   Problem: Sit to Stand Goal: LTG:  Patient will perform sit to stand in prep for activites of daily living with assistance level (OT) Description: LTG:  Patient will perform sit to stand in prep for activites of daily living with assistance level (OT) Flowsheets (Taken 09/27/2021 0903) LTG: PT will perform sit to stand in prep for activites of daily living with assistance level: Independent with assistive device   Problem: RH Bathing Goal: LTG Patient will bathe all body parts with assist levels (OT) Description: LTG: Patient will bathe all body parts with assist levels (OT) Flowsheets (Taken 09/27/2021 0903) LTG: Pt will perform bathing with assistance level/cueing: Supervision/Verbal cueing   Problem: RH Dressing Goal: LTG Patient will perform upper body dressing (OT) Description: LTG Patient will perform upper body dressing with assist, with/without cues (OT). Flowsheets (Taken 09/27/2021 0903) LTG: Pt will perform upper body dressing with assistance level of: Independent Goal: LTG Patient will perform lower body dressing w/assist (OT) Description: LTG: Patient will perform lower body dressing with assist, with/without cues in positioning using equipment (OT) Flowsheets (Taken 09/27/2021 0903) LTG: Pt will perform lower body dressing with assistance level of: Independent with assistive device   Problem: RH Toileting Goal: LTG Patient will perform toileting task (3/3 steps) with assistance level (OT) Description: LTG: Patient will perform toileting task (3/3 steps) with assistance level (OT)  Flowsheets (Taken 09/27/2021 0903) LTG: Pt will perform  toileting task (3/3 steps) with assistance level: Supervision/Verbal cueing   Problem: RH Toilet Transfers Goal: LTG Patient will perform toilet transfers w/assist (OT) Description: LTG: Patient will perform toilet transfers with assist, with/without cues using equipment (OT) Flowsheets (Taken 09/27/2021 0903) LTG: Pt will perform toilet transfers with assistance level of: Supervision/Verbal cueing   Problem: RH Tub/Shower Transfers Goal: LTG Patient will perform tub/shower transfers w/assist (OT) Description: LTG: Patient will perform tub/shower transfers with assist, with/without cues using equipment (OT) Flowsheets (Taken 09/27/2021 0903) LTG: Pt will perform tub/shower stall transfers with assistance level of: Supervision/Verbal cueing

## 2021-09-28 LAB — CBC
HCT: 31.6 % — ABNORMAL LOW (ref 36.0–46.0)
Hemoglobin: 10.6 g/dL — ABNORMAL LOW (ref 12.0–15.0)
MCH: 28.1 pg (ref 26.0–34.0)
MCHC: 33.5 g/dL (ref 30.0–36.0)
MCV: 83.8 fL (ref 80.0–100.0)
Platelets: 338 10*3/uL (ref 150–400)
RBC: 3.77 MIL/uL — ABNORMAL LOW (ref 3.87–5.11)
RDW: 13.2 % (ref 11.5–15.5)
WBC: 9.6 10*3/uL (ref 4.0–10.5)
nRBC: 0 % (ref 0.0–0.2)

## 2021-09-28 LAB — VITAMIN B12: Vitamin B-12: 357 pg/mL (ref 180–914)

## 2021-09-28 MED ORDER — GABAPENTIN 400 MG PO CAPS
400.0000 mg | ORAL_CAPSULE | Freq: Three times a day (TID) | ORAL | Status: DC
  Administered 2021-09-28 – 2021-10-09 (×33): 400 mg via ORAL
  Filled 2021-09-28 (×34): qty 1

## 2021-09-28 MED ORDER — SENNA 8.6 MG PO TABS
2.0000 | ORAL_TABLET | Freq: Every day | ORAL | Status: DC
  Administered 2021-09-28 – 2021-10-08 (×11): 17.2 mg via ORAL
  Filled 2021-09-28 (×11): qty 2

## 2021-09-28 MED ORDER — DOCUSATE SODIUM 100 MG PO CAPS
100.0000 mg | ORAL_CAPSULE | Freq: Three times a day (TID) | ORAL | Status: DC
  Administered 2021-09-28 – 2021-10-09 (×33): 100 mg via ORAL
  Filled 2021-09-28 (×34): qty 1

## 2021-09-28 MED ORDER — OXYCODONE HCL ER 10 MG PO T12A
20.0000 mg | EXTENDED_RELEASE_TABLET | Freq: Two times a day (BID) | ORAL | Status: DC
  Administered 2021-09-28 – 2021-10-01 (×6): 20 mg via ORAL
  Filled 2021-09-28 (×6): qty 2

## 2021-09-28 NOTE — Progress Notes (Signed)
Physical Therapy Session Note  Patient Details  Name: Katie Robertson MRN: [id number] Date of Birth: [birth year]  Today's Date: 09/28/2021 PT Individual Time: 1330-1440 PT Individual Time Calculation (min): 70 min   Short Term Goals: Week 1:  PT Short Term Goal 1 (Week 1): STG=LTG due to LOS  Skilled Therapeutic Interventions/Progress Updates:   Received pt sitting in WC with OT untangling hair - PT took over with care. Pt agreeable to PT treatment and reported pain 7/10 in chest. RN notified and present to administer pain medication. Repositioning, rest breaks, and distraction done to reduce pain levels. Session with emphasis on functional mobility/transfers, generalized strengthening and endurance, dynamic standing balance/coordination, stair navigation, and gait training. Continued untangling pt's hair for ~20 minutes while discussing home entry but due to time restrictions couldn't untangle all of it. Per pt's mother, ramp is too narrow to fit WC therefore discussed options for bumping up/down steps in [facility] as well as shower chair technique (demonstrated this yesterday). Pt transported to/from room in [facility] dependently for time management purposes. To simulate pt's mom's house, pt navigated 4 steps with L handrail and mod A fading to min A using shower chair technique while adhering to RLE NWB precautions - pt required assist from therapist to move shower chair up/down steps. Pt then reported wanting to return to her home (4 STE with 2 handrails) - informed pt that she will need another person to assist, at least with equipment management. Pt reports her 67 y/o step daughter and her boyfriend could come stay with her to assist, as she doesn't have the best relationship with her mother. Plan to practice "hopping" up steps backwards on Monday to simulate pt's home entry. Sit<>stand with RW and CGA and ambulated 79ft with RW and CGA with close WC follow, demonstrating good adherence to RLE NWB precautions -  stopped due to fatigue. Pt requested to stop by vending machine and independently purchased snacks from [facility] level. Returned to room and performed the following exercises with emphasis on LE strength/balance: -seated hip flexion 2x10 bilaterally  -seated LAQ 2x10 bilaterally  -standing L single leg heel raises 2x10 -standing R hip flexion 2x10 Concluded session with pt sitting in [facility] with all needs within reach and family present at bedside.   Therapy Documentation Precautions:  Precautions Precautions: Fall Precaution Booklet Issued: Yes (comment) Precaution Comments: per PA, [name], no sternal precautions Required Braces or Orthoses: Knee Immobilizer - Right Knee Immobilizer - Right: Other (comment) (for comfort) Restrictions Weight Bearing Restrictions: Yes RLE Weight Bearing: Weight bearing as tolerated LLE Weight Bearing: Weight bearing as tolerated  Therapy/Group: Individual Therapy Martin Majestic PT, DPT   09/28/2021, 7:31 AM

## 2021-09-28 NOTE — Progress Notes (Signed)
Occupational Therapy Session Note  Patient Details  Name: Katie Robertson MRN: [id number] Date of Birth: Mar 17, 1991  Today's Date: 09/28/2021 OT Individual Time: 4010-2725 OT Individual Time Calculation (min): 55 min    Short Term Goals: Week 1:  OT Short Term Goal 1 (Week 1): LTG=STG 2.2 ELOS  Skilled Therapeutic Interventions/Progress Updates:    Pt sleeping in bed upon arrival but easily aroused. Pt agreeable to getting OOB to change clothing. Supine>sit EOB with supervision. Sit<>stand and stand pivot transfer to w/c with min A. No c/o lightheadedness. Pt completed UB bathing with supervision. Pt required assistance threading pants over BLE. Standing at sink with min A for balance while pt pulled pants over hips. Reviewed home setup and DME recommendations. Pt with increased pain while sitting in w/c. Pt returned to bed with min A. Pt able to reposition in bed without assistance. Pt remained in bed with all needs within reach and bed alarm activated.   Therapy Documentation Precautions:  Precautions Precautions: Fall Precaution Booklet Issued: Yes (comment) Precaution Comments: per PA, [name], no sternal precautions Required Braces or Orthoses: Knee Immobilizer - Right Knee Immobilizer - Right: Other (comment) (for comfort) Restrictions Weight Bearing Restrictions: Yes RLE Weight Bearing: Weight bearing as tolerated LLE Weight Bearing: Weight bearing as tolerated Pain:  Pt c/o 7/10 pain, primarily in sternum   Therapy/Group: Individual Therapy  Rich Brave 09/28/2021, 8:59 AM

## 2021-09-28 NOTE — Progress Notes (Signed)
Occupational Therapy Session Note  Patient Details  Name: Rheagan Nayak MRN: [id number] Date of Birth: 05/27/1991  Today's Date: 09/28/2021 OT Individual Time: 1300-1330 OT Individual Time Calculation (min): 30 min    Short Term Goals: Week 1:  OT Short Term Goal 1 (Week 1): LTG=STG 2.2 ELOS  Skilled Therapeutic Interventions/Progress Updates:    Pt received supine with moderate amounts of pain in her sternum and ribs, un-rated, agreeable to shower for pain relief. Pt completed transfer to EOB with (S). She completed a stand pivot transfer with min A to maintain RLE NWB. Pt transferred into the shower and her RLE and L peripheral IV were occluded from water. Pt completed bathing with (S), remaining seated. She transferred back to her w/c with min A. Shirt donned with set up assist and pants with min A sit <> stand. Pt completed hair care at the sink with assist for back matted areas. Pt passed off to PT in room.   Therapy Documentation Precautions:  Precautions Precautions: Fall Precaution Booklet Issued: Yes (comment) Precaution Comments: per PA, [name], no sternal precautions Required Braces or Orthoses: Knee Immobilizer - Right Knee Immobilizer - Right: Other (comment) (for comfort) Restrictions Weight Bearing Restrictions: Yes RLE Weight Bearing: Weight bearing as tolerated LLE Weight Bearing: Weight bearing as tolerated  Therapy/Group: Individual Therapy  Crissie Reese 09/28/2021, 6:17 AM
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
## 2021-09-29 NOTE — Progress Notes (Signed)
Occupational Therapy Session Note  Patient Details  Name: Katie Robertson MRN: [id number] Date of Birth: 1990/09/23  Today's Date: 09/30/2021 OT Individual Time: 1761-6073 and 1345-1435 OT Individual Time Calculation (min): 30 min and 50 min   Short Term Goals: Week 1:  OT Short Term Goal 1 (Week 1): LTG=STG 2.2 ELOS  Skilled Therapeutic Interventions/Progress Updates:    Pt greeted in the w/c, receiving pain medicine from nurse. She was agreeable to session, requesting to work on her hair, detangling it. OT assisted with washing hair using hair washing tray and then pt + OT brushed/comb hair. Discussed using diaphragmatic breathing + aromatherapy (lavender provided) to holistically manage pain. OT also showed her exercises to complete at seated level using the gait belt to work on Lt LE strengthening for functional transfers. Pt remained sitting up at close of session.   2nd Session 1:1 tx (50 min) Pt greeted in the w/c and agreeable to session. She wanted to work on strength/flexibility of the Lt LE today (note per H&P documentation, Lt knee ROM ok per pts tolerance). Pt self propelled to the dayroom and then completed backward propulsions of her w/c just using the Lt LE 35 ft x5. To continue working on gentle ROM/flexibility of knee guided pt through towel slides x20 reps 2 sets. Transitioned to figure 4 stretching, ankle pumps/circumduction and gentle self massage of plantar fascia. Also taught pt knee to chest with the Lt LE. Pt reported stretches felt good and that she felt muscular tightness. Discussed importance of stretching muscles of Lt LE to improve functional sit<stand, LB ADL, and transfer capacities. Pt utilizing figure 4 and knee to chest positions in order to don her shoe with supervision. Pt reported this was the first time that she had been able to don shoe herself. We celebrated! Sit<stand in front of elevated table completed with Min A, OT's foot under pts Rt foot as she could  not maintain NWB status otherwise. This faded to CGA with repetition and vcs, pt able to keep her Rt LE extended during power ups and raised in stance to maintain NWB status. Discussed functional carryover of completing sit<stands this way at home in front of a kitchen counter or supportive sink. She then self propelled the w/c back to the room and we continued working on detangling hair. Pt remained sitting up, finishing up with detangling on her own. Call bell on bed and safety belt fastened.   Therapy Documentation Precautions:  Precautions Precautions: Fall Precaution Booklet Issued: Yes (comment) Precaution Comments: per PA, [name], no sternal precautions Required Braces or Orthoses: Knee Immobilizer - Right Knee Immobilizer - Right: Other (comment) (for comfort) Restrictions Weight Bearing Restrictions: (P) Yes RLE Weight Bearing: (P) Weight bearing as tolerated LLE Weight Bearing: (P) Weight bearing as tolerated ADL: ADL Eating: Set up Grooming: Setup Upper Body Bathing: Setup, Supervision/safety Lower Body Bathing: Minimal assistance Upper Body Dressing: Minimal assistance Lower Body Dressing: Moderate assistance, Maximal assistance Toileting: Moderate assistance Toilet Transfer: Moderate assistance Toilet Transfer Method: Stand pivot  Therapy/Group: Individual Therapy  Katie Robertson 09/30/2021, 12:56 PM
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
## 2021-09-29 NOTE — Progress Notes (Signed)
PROGRESS NOTE   Subjective/Complaints: Pt having some headaches since last noc, discussed rebound HA from short acting analgesics    ROS: +sternal pain- continues to be severe.    Objective:   No results found. Recent Labs    09/27/21 0533 09/28/21 0605  WBC 11.3* 9.6  HGB 10.5* 10.6*  HCT 32.0* 31.6*  PLT 343 338    Recent Labs    09/27/21 0533  NA 142  K 3.9  CL 106  CO2 26  GLUCOSE 97  BUN 11  CREATININE 0.60  CALCIUM 9.2     Intake/Output Summary (Last 24 hours) at 09/29/2021 0859 Last data filed at 09/29/2021 0840 Gross per 24 hour  Intake 800 ml  Output --  Net 800 ml         Physical Exam: Vital Signs Blood pressure 115/82, pulse 84, temperature 97.6 F (36.4 C), temperature source Oral, resp. rate 15, height 5' 7.5" (1.715 m), weight 92.8 kg, SpO2 99 %.  General: No acute distress Mood and affect are appropriate Heart: Regular rate and rhythm no rubs murmurs or extra sounds Lungs: Clear to auscultation, breathing unlabored, no rales or wheezes Abdomen: Positive bowel sounds, soft nontender to palpation, nondistended Extremities: No clubbing, cyanosis, or edema Skin: No evidence of breakdown, no evidence of rash Neurologic: Cranial nerves II through XII intact, motor strength is 5/5 in bilateral deltoid, bicep, tricep, grip,3/5 hip flexor, knee extensors, Left ankle dorsiflexor and plantar flexor, RLE in [facility]    Skin: intact Musculoskeletal:     Comments: Right ankle splinted--toes numb.  Right knee immobilizer in place Neurological:     Mental Status: She is alert and oriented to person, place, and time.   Assessment/Plan: 1. Functional deficits which require 3+ hours per day of interdisciplinary therapy in a comprehensive inpatient rehab setting. Physiatrist is providing close team supervision and 24 hour management of active medical problems listed below. Physiatrist and rehab team  continue to assess barriers to discharge/monitor patient progress toward functional and medical goals  Care Tool:  Bathing    Body parts bathed by patient: Right arm, Left arm, Chest, Abdomen, Front perineal area, Buttocks, Left upper leg, Right upper leg, Face   Body parts bathed by helper: Left lower leg Body parts n/a: Right lower leg   Bathing assist Assist Level: Minimal Assistance - Patient > 75%     Upper Body Dressing/Undressing Upper body dressing   What is the patient wearing?: Pull over shirt    Upper body assist Assist Level: Minimal Assistance - Patient > 75%    Lower Body Dressing/Undressing Lower body dressing      What is the patient wearing?: Pants     Lower body assist Assist for lower body dressing: Maximal Assistance - Patient 25 - 49%     Toileting Toileting    Toileting assist Assist for toileting: Moderate Assistance - Patient 50 - 74%     Transfers Chair/bed transfer  Transfers assist     Chair/bed transfer assist level: Contact Guard/Touching assist     Locomotion Ambulation   Ambulation assist      Assist level: Contact Guard/Touching assist Assistive device: Walker-rolling Max distance:  59ft   Walk 10 feet activity   Assist     Assist level: Contact Guard/Touching assist Assistive device: Walker-rolling   Walk 50 feet activity   Assist    Assist level: Contact Guard/Touching assist Assistive device: Walker-rolling    Walk 150 feet activity   Assist Walk 150 feet activity did not occur: Safety/medical concerns (chest pain, weakness, fatigue, altered balance strategies)         Walk 10 feet on uneven surface  activity   Assist Walk 10 feet on uneven surfaces activity did not occur: Safety/medical concerns (chest pain, weakness, fatigue, altered balance strategies)         Wheelchair     Assist Is the patient using a wheelchair?: Yes Type of Wheelchair: Manual    Wheelchair assist level:  Supervision/Verbal cueing Max wheelchair distance: 74ft    Wheelchair 50 feet with 2 turns activity    Assist        Assist Level: Dependent - Patient 0%   Wheelchair 150 feet activity     Assist      Assist Level: Dependent - Patient 0%   Blood pressure 115/82, pulse 84, temperature 97.6 F (36.4 C), temperature source Oral, resp. rate 15, height 5' 7.5" (1.715 m), weight 92.8 kg, SpO2 99 %.  Medical Problem List and Plan: 1. Functional deficits secondary to polytrauma             -patient may shower             -ELOS/Goals: 8 days             HFUs scheduled with Zella Ball and myself  Discussed with Vicente Males patient's goal to leave by 2/23. Discussed with patient that she is on track for this! 2.  Antithrombotics: -DVT/anticoagulation:  Pharmaceutical: Lovenox             -antiplatelet therapy: N/A 3. Pain from rib and sternal fractures: Increase OxyContin to 20 mg BID added for more consistent pain relief.  Add lidocaine patch to ribs and to sternum.  --Continue oxycodone 15 mg as needed for breakthrough pain --Decrease Tylenol to 650 mg 4 times daily.   --Continue Neurontin 300 mg and Robaxin 100 mg 3 times daily. --Tramadol 4 times daily was added 02/15 4. Mood: LCSW for evaluation and to provide support.               -antipsychotic agents: N/A 5. Neuropsych: This patient is capable of making decisions on her own behalf. 6. Skin/Wound Care: Monitor wound for healing. Protein supplements and vitamins added to promote healing.  7. Fluids/Electrolytes/Nutrition: Monitor I/O. Check CMET in am. 8.  Right trimalleolar ankle fracture s/p ORIF: NWB LLE with splint in place. Check vitamin D level tomorrow morning. 9.  Right tibial plateau fracture: Knee immobilizer on RLE for comfort only 10.  Left tibial eminence fracture: WBAT LLE 11.  Bilateral rib fractures/sternal fractures: Lidocaine patch as well as ice for local measures. 12.  Pyelonephritis: Continue cefdinir to  complete antibiotic course 13.  ABLA: Recheck CBC in AM. Check iron level tomorrow morning 14.  Constipation: managed on current regimen. Discussed with her that magnesium level is normal. Changed senna to 2 tabs HS and colace to 100mg  TID 15. Leukocytosis: resolved, repeat CBC Monday.  16. Vitamin D deficiency: start ergocalciferol 50,000U once per week for 7 weeks.   LOS: 3 days A FACE TO FACE EVALUATION WAS PERFORMED  Charlett Blake 09/29/2021, 8:59 AM

## 2021-09-30 NOTE — Progress Notes (Addendum)
Physical Therapy Session Note  Patient Details  Name: Katie Robertson MRN: [id number] Date of Birth: June 07, 1991  Today's Date: 09/30/2021 PT Individual Time: 0832-0925 PT Individual Time Calculation (min): 53 min   Short Term Goals: Week 1:  PT Short Term Goal 1 (Week 1): STG=LTG due to LOS  Skilled Therapeutic Interventions/Progress Updates:    Pt presents in bed and agreeable to session. Discussed overall goals including stair negotiation (change in plans to her house), concert attendance (ways to modify including use of w/c, considering endurance, and ADA compliance rules), and d/c planning. Performs bed mobility mod I to come to EOB. Min assist for sit > stand with RW with bed slightly elevated to simulate home environment. Initial plan to perform gait with RW towards therapy gym to tolerance but due to fatigue pt only able to go about 5' with CGA to w/c with RW maintaining NWB status. Pt able to propel w/c mod I on unit to and from therapies for functional strengthening and cardiovascular endurance. Problem solved through various transfers in the home environment (w/c vs RW and when would have RW available). Focused on pt managing w/c parts independently. Mod I for bed mobility on flat surface and focused on LLE strengthening per pt request (deferred stairs until more warmed up this morning and plan to do in next session). Focused on quad sets (use of hand for feedback), heel slides, hip abduction, modified bridges and seated LAQ x 10-15 reps each. Educated on technique and feedback provided. Pt reports feeling significant weakness in LLE and recommended no weights initially until 10 reps feel "easy" and then can progress to weights. Trialled a 2.5 # on L for hip abduction and able to perform and tolerate but fatigued quicker. Educated on "exhale on exertion" to help with rib and sternal pain during bed mobility transitions as well as during sit to stands as noted pt sometimes holding her breath.  Returned back to w/c with CGA with RW for balance; left up in w/c with all needs in reach.   Therapy Documentation Precautions:  Precautions Precautions: Fall Precaution Booklet Issued: Yes (comment) Precaution Comments: per PA, [name], no sternal precautions Required Braces or Orthoses: Knee Immobilizer - Right Knee Immobilizer - Right: Other (comment) (for comfort) Restrictions Weight Bearing Restrictions: (P) Yes RLE Weight Bearing: (P) Weight bearing as tolerated LLE Weight Bearing: (P) Weight bearing as tolerated  Pain: Notified RN for pain medication. Reports most of pain in in sternum and R knee - did not rate.     Therapy/Group: Individual Therapy  Karolee Stamps Darrol Poke, PT, DPT, CBIS  09/30/2021, 9:33 AM

## 2021-09-30 NOTE — Progress Notes (Signed)
PROGRESS NOTE   Subjective/Complaints: Pt concerned about ongoing rib pain L>R , pt is ~1wk post trauma, discussed that pain is expected for at least 6 wks post fx.  Wears lidoderm on RIght     ROS: neg SOB, Neg N/V/D   Objective:   No results found. Recent Labs    09/28/21 0605  WBC 9.6  HGB 10.6*  HCT 31.6*  PLT 338    No results for input(s): NA, K, CL, CO2, GLUCOSE, BUN, CREATININE, CALCIUM in the last 72 hours.   Intake/Output Summary (Last 24 hours) at 09/30/2021 1201 Last data filed at 09/30/2021 0825 Gross per 24 hour  Intake 720 ml  Output --  Net 720 ml         Physical Exam: Vital Signs Blood pressure 113/78, pulse 85, temperature 97.9 F (36.6 C), temperature source Oral, resp. rate 16, height 5' 7.5" (1.715 m), weight 92.8 kg, SpO2 98 %.  General: No acute distress Mood and affect are appropriate Heart: Regular rate and rhythm no rubs murmurs or extra sounds Lungs: Clear to auscultation, breathing unlabored, no rales or wheezes, left ant rib pain increased with deep breath  Abdomen: Positive bowel sounds, soft nontender to palpation, nondistended Extremities: No clubbing, cyanosis, or edema Skin: No evidence of breakdown, no evidence of rash Neurologic: Cranial nerves II through XII intact, motor strength is 5/5 in bilateral deltoid, bicep, tricep, grip,3/5 hip flexor, knee extensors, Left ankle dorsiflexor and plantar flexor, RLE in [facility]    Skin: intact Musculoskeletal:     Comments: Right ankle splinted--toes numb.  Right knee immobilizer in place Neurological:     Mental Status: She is alert and oriented to person, place, and time.   Assessment/Plan: 1. Functional deficits which require 3+ hours per day of interdisciplinary therapy in a comprehensive inpatient rehab setting. Physiatrist is providing close team supervision and 24 hour management of active medical problems listed  below. Physiatrist and rehab team continue to assess barriers to discharge/monitor patient progress toward functional and medical goals  Care Tool:  Bathing    Body parts bathed by patient: Right arm, Left arm, Chest, Abdomen, Front perineal area, Buttocks, Left upper leg, Right upper leg, Face   Body parts bathed by helper: Left lower leg Body parts n/a: Right lower leg   Bathing assist Assist Level: Minimal Assistance - Patient > 75%     Upper Body Dressing/Undressing Upper body dressing   What is the patient wearing?: Pull over shirt    Upper body assist Assist Level: Minimal Assistance - Patient > 75%    Lower Body Dressing/Undressing Lower body dressing      What is the patient wearing?: Pants     Lower body assist Assist for lower body dressing: Maximal Assistance - Patient 25 - 49%     Toileting Toileting    Toileting assist Assist for toileting: 2 Helpers     Transfers Chair/bed transfer  Transfers assist     Chair/bed transfer assist level: Contact Guard/Touching assist     Locomotion Ambulation   Ambulation assist      Assist level: Contact Guard/Touching assist Assistive device: Walker-rolling Max distance: 61ft   Walk  10 feet activity   Assist     Assist level: Contact Guard/Touching assist Assistive device: Walker-rolling   Walk 50 feet activity   Assist    Assist level: Contact Guard/Touching assist Assistive device: Walker-rolling    Walk 150 feet activity   Assist Walk 150 feet activity did not occur: Safety/medical concerns (chest pain, weakness, fatigue, altered balance strategies)         Walk 10 feet on uneven surface  activity   Assist Walk 10 feet on uneven surfaces activity did not occur: Safety/medical concerns (chest pain, weakness, fatigue, altered balance strategies)         Wheelchair     Assist Is the patient using a wheelchair?: Yes Type of Wheelchair: Manual    Wheelchair assist  level: Supervision/Verbal cueing Max wheelchair distance: 3ft    Wheelchair 50 feet with 2 turns activity    Assist        Assist Level: Dependent - Patient 0%   Wheelchair 150 feet activity     Assist      Assist Level: Dependent - Patient 0%   Blood pressure 113/78, pulse 85, temperature 97.9 F (36.6 C), temperature source Oral, resp. rate 16, height 5' 7.5" (1.715 m), weight 92.8 kg, SpO2 98 %.  Medical Problem List and Plan: 1. Functional deficits secondary to polytrauma             -patient may shower             -ELOS/Goals: 8 days   Discussed with Tobi Bastos patient's goal to leave by 2/23. Will need team assessment , family asking about outp vs HH therapy 2.  Antithrombotics: -DVT/anticoagulation:  Pharmaceutical: Lovenox             -antiplatelet therapy: N/A 3. Pain from rib and sternal fractures: Increase OxyContin to 20 mg BID added for more consistent pain relief.  Add lidocaine patch to ribs and to sternum.  --Continue oxycodone 15 mg as needed for breakthrough pain --Decrease Tylenol to 650 mg 4 times daily.   --Continue Neurontin 300 mg and Robaxin 100 mg 3 times daily. --Tramadol 4 times daily was added 02/15 4. Mood: LCSW for evaluation and to provide support.               -antipsychotic agents: N/A 5. Neuropsych: This patient is capable of making decisions on her own behalf. 6. Skin/Wound Care: Monitor wound for healing. Protein supplements and vitamins added to promote healing.  7. Fluids/Electrolytes/Nutrition: Monitor I/O. Check CMET in am. 8.  Right trimalleolar ankle fracture s/p ORIF: NWB LLE with splint in place. Check vitamin D level tomorrow morning. 9.  Right tibial plateau fracture: Knee immobilizer on RLE for comfort only 10.  Left tibial eminence fracture: WBAT LLE 11.  Bilateral rib fractures/sternal fractures: Lidocaine patch as well as ice for local measures. 12.  Pyelonephritis: Continue cefdinir to complete antibiotic course 13.   ABLA: Recheck CBC in AM. Check iron level tomorrow morning 14.  Constipation: managed on current regimen. Discussed with her that magnesium level is normal. Changed senna to 2 tabs HS and colace to 100mg  TID 15. Leukocytosis: resolved, repeat CBC Monday.  16. Vitamin D deficiency: start ergocalciferol 50,000U once per week for 7 weeks.   LOS: 4 days A FACE TO FACE EVALUATION WAS PERFORMED  Thursday 09/30/2021, 12:01 PM

## 2021-09-30 NOTE — Progress Notes (Signed)
Physical Therapy Session Note  Patient Details  Name: Munira Haapala MRN: [id number] Date of Birth: 1991-01-25  Today's Date: 09/30/2021 PT Individual Time: 1003-1100 PT Individual Time Calculation (min): 57 min   Short Term Goals: Week 1:  PT Short Term Goal 1 (Week 1): STG=LTG due to LOS  Skilled Therapeutic Interventions/Progress Updates: Pt presents sitting in w/c and agreeable to therapy.  Pt wheeled self to main gym for attempted stair negotiation.  PT discussed w/ pt need for safety w/ D/C to appropriate home but will attempt  4 steps w/ 2 rails as she has at here home.  PT states that will recommend safest option for her upon D/C.  Pt transfers sit to stand w/ min A and amb x 10' to 6" stairs.  Pt attempts ascending steps backwards for simulation of own home steps.  Pt able to perform x 1 step backwards 2/2 pain to sternum area and strength deficits. Pt required seated rest break 2/2 fatigue and weakness.  Pt the amb to steps w/ RW and attempted stairs in forward position using B rails, but unable to attempt.  Pt amb x 48' w/ 1 standing rest break and 28' w/ CGA and RW.  PT returnedpt to room and applied chair alarm in room.  PT able to move self about room in w/c.     Therapy Documentation Precautions:  Precautions Precautions: Fall Precaution Booklet Issued: Yes (comment) Precaution Comments: per PA, [name], no sternal precautions Required Braces or Orthoses: Knee Immobilizer - Right Knee Immobilizer - Right: Other (comment) (for comfort) Restrictions Weight Bearing Restrictions: (P) Yes RLE Weight Bearing: (P) Weight bearing as tolerated LLE Weight Bearing: (P) Weight bearing as tolerated General:   Vital Signs:   Pain:5/10 initially, but increased to 8/10 w/ activity. Pain Assessment Pain Score: 5  Mobility:       Therapy/Group: Individual Therapy  Ladoris Gene 09/30/2021, 12:26 PM

## 2021-10-01 ENCOUNTER — Inpatient Hospital Stay (HOSPITAL_COMMUNITY): Payer: Medicaid Other

## 2021-10-01 LAB — CBC
HCT: 33.9 % — ABNORMAL LOW (ref 36.0–46.0)
Hemoglobin: 11.1 g/dL — ABNORMAL LOW (ref 12.0–15.0)
MCH: 28.2 pg (ref 26.0–34.0)
MCHC: 32.7 g/dL (ref 30.0–36.0)
MCV: 86 fL (ref 80.0–100.0)
Platelets: 433 10*3/uL — ABNORMAL HIGH (ref 150–400)
RBC: 3.94 MIL/uL (ref 3.87–5.11)
RDW: 13.4 % (ref 11.5–15.5)
WBC: 7.6 10*3/uL (ref 4.0–10.5)
nRBC: 0 % (ref 0.0–0.2)

## 2021-10-01 LAB — BASIC METABOLIC PANEL
Anion gap: 9 (ref 5–15)
BUN: 19 mg/dL (ref 6–20)
CO2: 27 mmol/L (ref 22–32)
Calcium: 9.1 mg/dL (ref 8.9–10.3)
Chloride: 100 mmol/L (ref 98–111)
Creatinine, Ser: 0.84 mg/dL (ref 0.44–1.00)
GFR, Estimated: >60 mL/min (ref >60)
Glucose, Bld: 97 mg/dL (ref 70–99)
Potassium: 4.1 mmol/L (ref 3.5–5.1)
Sodium: 136 mmol/L (ref 135–145)

## 2021-10-01 MED ORDER — FENTANYL 12 MCG/HR TD PT72
1.0000 | MEDICATED_PATCH | TRANSDERMAL | Status: DC
  Administered 2021-10-01 – 2021-10-07 (×3): 1 via TRANSDERMAL
  Filled 2021-10-01 (×3): qty 1

## 2021-10-01 MED ORDER — OXYCODONE HCL ER 15 MG PO T12A
15.0000 mg | EXTENDED_RELEASE_TABLET | Freq: Two times a day (BID) | ORAL | Status: DC
  Administered 2021-10-01 – 2021-10-08 (×14): 15 mg via ORAL
  Filled 2021-10-01 (×14): qty 1

## 2021-10-01 NOTE — Progress Notes (Signed)
PROGRESS NOTE   Subjective/Complaints: No new complaints with me this morning Pain has been stable- ok to keep same regimen for now. She asks if it is normal for her to still be having pain Had a good BM yesterday   ROS: neg SOB, Neg N/V/D, constipation   Objective:   No results found. Recent Labs    10/01/21 0510  WBC 7.6  HGB 11.1*  HCT 33.9*  PLT 433*   Recent Labs    10/01/21 0510  NA 136  K 4.1  CL 100  CO2 27  GLUCOSE 97  BUN 19  CREATININE 0.84  CALCIUM 9.1    Intake/Output Summary (Last 24 hours) at 10/01/2021 1224 Last data filed at 10/01/2021 0804 Gross per 24 hour  Intake 810 ml  Output --  Net 810 ml        Physical Exam: Vital Signs Blood pressure 110/76, pulse 84, temperature (!) 97.5 F (36.4 C), temperature source Oral, resp. rate 18, height 5' 7.5" (1.715 m), weight 92.8 kg, SpO2 97 %.  General: No acute distress, BMI 31.57 Mood and affect are appropriate Heart: Regular rate and rhythm no rubs murmurs or extra sounds Lungs: Clear to auscultation, breathing unlabored, no rales or wheezes, left ant rib pain increased with deep breath  Abdomen: Positive bowel sounds, soft nontender to palpation, nondistended Extremities: No clubbing, cyanosis, or edema Skin: No evidence of breakdown, no evidence of rash Neurologic/MSK: Cranial nerves II through XII intact, motor strength is 5/5 in bilateral deltoid, bicep, tricep, grip,3/5 hip flexor, knee extensors, Left ankle dorsiflexor and plantar flexor, RLE in [facility]    Comments: Right ankle splinted--toes numb.  Right knee immobilizer in place    Mental Status: She is alert and oriented to person, place, and time.   Assessment/Plan: 1. Functional deficits which require 3+ hours per day of interdisciplinary therapy in a comprehensive inpatient rehab setting. Physiatrist is providing close team supervision and 24 hour management of active medical  problems listed below. Physiatrist and rehab team continue to assess barriers to discharge/monitor patient progress toward functional and medical goals  Care Tool:  Bathing    Body parts bathed by patient: Right arm, Left arm, Chest, Abdomen, Face   Body parts bathed by helper: Left lower leg Body parts n/a: Right lower leg   Bathing assist Assist Level: Set up assist     Upper Body Dressing/Undressing Upper body dressing   What is the patient wearing?: Pull over shirt, Bra    Upper body assist Assist Level: Set up assist    Lower Body Dressing/Undressing Lower body dressing      What is the patient wearing?: Pants     Lower body assist Assist for lower body dressing: Minimal Assistance - Patient > 75%     Toileting Toileting    Toileting assist Assist for toileting: 2 Helpers     Transfers Chair/bed transfer  Transfers assist     Chair/bed transfer assist level: Contact Guard/Touching assist     Locomotion Ambulation   Ambulation assist      Assist level: Contact Guard/Touching assist Assistive device: Walker-rolling Max distance: 48   Walk 10 feet activity  Assist     Assist level: Contact Guard/Touching assist Assistive device: Walker-rolling   Walk 50 feet activity   Assist    Assist level: Contact Guard/Touching assist Assistive device: Walker-rolling    Walk 150 feet activity   Assist Walk 150 feet activity did not occur: Safety/medical concerns (chest pain, weakness, fatigue, altered balance strategies)         Walk 10 feet on uneven surface  activity   Assist Walk 10 feet on uneven surfaces activity did not occur: Safety/medical concerns (chest pain, weakness, fatigue, altered balance strategies)         Wheelchair     Assist Is the patient using a wheelchair?: Yes Type of Wheelchair: Manual    Wheelchair assist level: Supervision/Verbal cueing Max wheelchair distance: 100    Wheelchair 50 feet with 2  turns activity    Assist        Assist Level: Supervision/Verbal cueing   Wheelchair 150 feet activity     Assist      Assist Level: Dependent - Patient 0%   Blood pressure 110/76, pulse 84, temperature (!) 97.5 F (36.4 C), temperature source Oral, resp. rate 18, height 5' 7.5" (1.715 m), weight 92.8 kg, SpO2 97 %.  Medical Problem List and Plan: 1. Functional deficits secondary to polytrauma             -patient may shower             -ELOS/Goals: 8 days  Continue CIR  Discussed with Tobi Bastos patient's goal to leave by 2/23. Will need team assessment , family asking about outp vs HH therapy 2.  Antithrombotics: -DVT/anticoagulation:  Pharmaceutical: Lovenox             -antiplatelet therapy: N/A 3. Pain from rib and sternal fractures: Increase OxyContin to 20 mg BID added for more consistent pain relief.  Add lidocaine patch to ribs and to sternum.  --Continue oxycodone 15 mg as needed for breakthrough pain --Decrease Tylenol to 650 mg 4 times daily.   --Continue Neurontin 300 mg and Robaxin 100 mg 3 times daily. --Tramadol 4 times daily was added 02/15 4. Mood: LCSW for evaluation and to provide support.               -antipsychotic agents: N/A 5. Neuropsych: This patient is capable of making decisions on her own behalf. 6. Skin/Wound Care: Monitor wound for healing. Protein supplements and vitamins added to promote healing.  7. Fluids/Electrolytes/Nutrition: Monitor I/O. Check CMET in am. 8.  Right trimalleolar ankle fracture s/p ORIF: NWB LLE with splint in place. Check vitamin D level tomorrow morning. 9.  Right tibial plateau fracture: Knee immobilizer on RLE for comfort only 10.  Left tibial eminence fracture: WBAT LLE. CT today to assess for soft tissue injury given level of pain. 11.  Bilateral rib fractures/sternal fractures: Lidocaine patch as well as ice for local measures. 12.  Pyelonephritis: Continue cefdinir to complete antibiotic course 13.  ABLA: Recheck  CBC in AM. Check iron level tomorrow morning 14.  Constipation: managed on current regimen. Discussed with her that magnesium level is normal. Changed senna to 2 tabs HS and colace to 100mg  TID. Continue current regimen.  15. Leukocytosis: resolved, monitor weekly  16. Vitamin D deficiency: start ergocalciferol 50,000U once per week for 7 weeks.   LOS: 5 days A FACE TO FACE EVALUATION WAS PERFORMED  Arav Bannister 10/01/2021, 12:24 PM

## 2021-10-01 NOTE — Progress Notes (Signed)
Discussed X ray results with Sarah/Trauma ortho PA. They are aware of tibial plateau fracture and ok to WBAT. She recommended hinged brace for PCL support and she will follow up with patient on Wed and discuss referral to sports ortho.
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
## 2021-10-01 NOTE — Progress Notes (Signed)
Physical Therapy Session Note  Patient Details  Name: Katie Robertson MRN: [id number] Date of Birth: 06/05/1991  Today's Date: 10/01/2021 PT Individual Time: 0102-7253 PT Individual Time Calculation (min): 47 min   Today's Date: 10/01/2021 PT Missed Time: 28 Minutes Missed Time Reason: CT/MRI;Other (Comment) (requesting time with visitor)  Short Term Goals: Week 1:  PT Short Term Goal 1 (Week 1): STG=LTG due to LOS  Skilled Therapeutic Interventions/Progress Updates:   Received pt sitting in [facility] with visitor present. Pt agreeable to PT treatment and reported pain 7/10 in LLE (premedicated). MD planning to order CT scan and pt requesting to hold off on standing activities today. PA, [name], arrived during session and suggested holding off on standing activities until imaging is performed. Session with emphasis on functional mobility/transfers, generalized strengthening and ROM, and improved activity tolerance. Pt performed WC mobility 140ft using BUE and supervision to/from dayroom and transferred on/off Nustep via lateral scoot with CGA. Pt performed BUE strengthening only on workload 6 for 8 minutes for a total of 220 steps with emphasis on UE strength and cardiovascular endurance. Pt then transferred on/off mat via lateral scoot with CGA and performed the following exercises: -single arm bicep curls with 4lb dumbbell 2x10 bilaterally -seated trunk rotations x10 bilaterally  -seated horizontal chest press with 5lb dowel 2x10  -overhead chest press with 5lb dowel 2x10 -hip flexion 2x12 bilaterally with 1lb ankle weight  -LAQ on RLE 2x12 with 1lb ankle weight  MD changed order to stat CT scan and pt requested to return to room to visit with friend prior to leaving for CT scan. Concluded session with pt sitting in [facility] with all needs within reach. 28 minutes missed of skilled physical therapy due to CT and request to spend time with visitor.  Therapy Documentation Precautions:   Precautions Precautions: Fall Precaution Booklet Issued: Yes (comment) Precaution Comments: per PA, [name], no sternal precautions Required Braces or Orthoses: Knee Immobilizer - Right Knee Immobilizer - Right: Other (comment) (for comfort) Restrictions Weight Bearing Restrictions: Yes RLE Weight Bearing: Non weight bearing LLE Weight Bearing: Weight bearing as tolerated  Therapy/Group: Individual Therapy Martin Majestic PT, DPT   10/01/2021, 7:34 AM

## 2021-10-01 NOTE — Discharge Instructions (Addendum)
Inpatient Rehab Discharge Instructions  Raea Magallon Discharge date and time:  10/09/21  Activities/Precautions/ Functional Status: Activity: no lifting, driving, or strenuous exercise till cleared by MD  Diet: regular diet Wound Care: Ok to shower, incisions may get wet. Do not scrub incisions. Do not apply any ointments or creams to ankle incisions   Functional status:  ___ No restrictions     ___ Walk up steps independently ___ 24/7 supervision/assistance   ___ Walk up steps with assistance ___ Intermittent supervision/assistance  ___ Bathe/dress independently ___ Walk with walker     ___ Bathe/dress with assistance ___ Walk Independently    ___ Shower independently ___ Walk with assistance    ___ Shower with assistance ___ No alcohol     ___ Return to work/school ________  COMMUNITY REFERRALS UPON DISCHARGE:    Medical Equipment/Items Ordered: Wheelchair, Agricultural consultant, Tub Transfer Bench                                                  Agency/Supplier: [employer] [id number]  Special Instructions: No weight on right leg. Wear CAM boot when out of bed.  Wear left knee brace locked in extension when out of bed.  Can unlock for range of motion as tolerated WHEN IN BED.    My questions have been answered and I understand these instructions. I will adhere to these goals and the provided educational materials after my discharge from the hospital.  Patient/Caregiver Signature _______________________________ Date __________  Clinician Signature _______________________________________ Date __________  Please bring this form and your medication list with you to all your follow-up doctor's appointments.

## 2021-10-01 NOTE — Progress Notes (Signed)
Occupational Therapy Session Note  Patient Details  Name: Katie Robertson MRN: [id number] Date of Birth: 23-Oct-1990  Today's Date: 10/01/2021 OT Individual Time: [id number] OT Individual Time Calculation (min): 54 min    Short Term Goals: Week 1:  OT Short Term Goal 1 (Week 1): LTG=STG 2.2 ELOS  Skilled Therapeutic Interventions/Progress Updates:  Skilled OT intervention completed with focus on self-care, ADL retraining, functional transfers, DME education. Pt received seated EOB, agreeable to session. Pt requesting to complete self-care this session. Pt completed sit > stand using RW and bed highly elevated per pt request, with min A to stand, and min A stand pivot to w/c, with pt needing frequent cues to take weight off her RLE for precaution adherence. Seated in w/c pt completed UB bathing/dressing with set up A, with donning of bra this session with good sternal pain tolerance. Therapist combed, de-tangled, and french braided pt's hair during UB self-care per pt request. Discussed with pt her bathroom set up with pt stating she plans to stay with her mom until at a more independent level, with her mother's bathroom being handicap accessible. Educated on DME that therapist would recommend with pt already having something to sit down on in shower. Pt completed doffing of pants using lateral lean in w/c with supervision, then min A needed for donning of LB clothes. Would benefit from AE education via reacher. Pt requesting to roam the halls of the hospital and go outside with a visitor, with NT in room stating we would request to MD for a grounds pass to enable overall well being with out of room visits and freedom. Pt left seated in w/c, with belt alarm on and all needs in reach and NT in room changing bed sheets at end of session.   Therapy Documentation Precautions:  Precautions Precautions: Fall Precaution Booklet Issued: Yes (comment) Precaution Comments: per PA, [name], no sternal  precautions Required Braces or Orthoses: Knee Immobilizer - Right Knee Immobilizer - Right: Other (comment) (for comfort) Restrictions Weight Bearing Restrictions: Yes RLE Weight Bearing: Non weight bearing LLE Weight Bearing: Weight bearing as tolerated  Pain: 7/10, premedicated, declined other intervention besides repositioning for comfort during session   Therapy/Group: Individual Therapy  Wandalee Klang E Jac Romulus 10/01/2021, 7:35 AM

## 2021-10-02 MED ORDER — MAGNESIUM GLUCONATE 500 MG PO TABS
250.0000 mg | ORAL_TABLET | Freq: Every day | ORAL | Status: DC
  Administered 2021-10-02 – 2021-10-07 (×6): 250 mg via ORAL
  Filled 2021-10-02 (×6): qty 1

## 2021-10-02 NOTE — Progress Notes (Signed)
Physical Therapy Session Note  Patient Details  Name: Katie Robertson MRN: [id number] Date of Birth: [birth year]  Today's Date: 10/02/2021 PT Individual Time: 9476-5465 PT Individual Time Calculation (min): 56 min   Short Term Goals: Week 1:  PT Short Term Goal 1 (Week 1): STG=LTG due to LOS  Skilled Therapeutic Interventions/Progress Updates:   New WB brace orders as of 2/21- pt able to perform sit<>stands with hinged brace unlocked but then needs to be locked into extension for WB in standing/gait activities.   Received pt sitting in WC, pt agreeable to PT treatment, and reported pain 7/10 in sternum (premedicated). Session with emphasis on functional mobility/transfers, generalized strengthening, dynamic standing balance/coordination, simulated car transfers, and improved activity tolerance. Pt performed WC mobility 1107ft using BUE and mod I then requested to go outside - transported outside to entrance of [facility] in WC dependently and transferred sit<>stand with RW and mod A, upon standing pt reporting difficulty extending L knee and unable to remain standing for >10 seconds prior to sitting; pt also demonstrating slight RLE TDWB rather than NWB. Attempted to stand again, however pt ultimately unable due to pain. Pt reports plan is now to go back home with mother (2 STE and L handrail). Discussed how previous method of using shower chair may not be possible with new brace, increased pain, and difficulty extending L knee and ultimately recommending pt get temporary ramp installed or see if someone could assist in bumping her up/down the stairs. Pt performed WC mobility additional 215ft x 1 and >328ft x 1 using BUE mod I throughout session. In ortho gym, pt performed simulated car transfer with slideboard and supervision with verbal cues for technique, hand placement, board placement, and positioning of LEs when entering/exiting the car. Pt politely declined any further standing due to increased L knee  pain. Pt performed the following exercises seated with supervision and verbal cues for technique with emphasis on LE strength: -LAQ 2x12 bilaterally -hip flexion 2x12 bilaterally  -hip adduciton ball squeezes 2x15 Concluded session with pt sitting in WC with all needs within reach. Provided pt with ice pack for L knee.   Therapy Documentation Precautions:  Precautions Precautions: Fall Precaution Booklet Issued: Yes (comment) Precaution Comments: per PA, [name], no sternal precautions Required Braces or Orthoses: Knee Immobilizer - Right Knee Immobilizer - Right: Other (comment) (for comfort) Restrictions Weight Bearing Restrictions: Yes RLE Weight Bearing: Non weight bearing LLE Weight Bearing: Weight bearing as tolerated  Therapy/Group: Individual Therapy Martin Majestic PT, DPT   10/02/2021, 7:44 AM

## 2021-10-02 NOTE — Progress Notes (Signed)
Physical Therapy Session Note  Patient Details  Name: Katie Robertson MRN: [id number] Date of Birth: 02-02-1991  Today's Date: 10/02/2021 PT Individual Time: 1005-1050 PT Individual Time Calculation (min): 45 min   Short Term Goals: Week 1:  PT Short Term Goal 1 (Week 1): STG=LTG due to LOS  Skilled Therapeutic Interventions/Progress Updates:    Clarified new WB/brace orders for LLE with the PA: pt able to perform sit <> stand with hinged brace unlocked but then needs to be locked into extension for WB in standing/gait activities. Reviewed this information with pt as well.   Discussed overall d/c plan (pt now stating the plan is to go back to her mothers house) and changes in mobility. Reviewed home set up and use of w/c vs RW especially if pain is limiting ability for standing/gait. Pt states she thinks she will likely use the w/c more than the walker in the home anyway. Focused on w/c level transfers in ADL apartment to regular couch to simulate home environment access and pt able to perform with CGA for balance and safety for a scoot pivot transfer. Pt does occasionally perform TDWB on the RLE for balance during transfers.   Focused on LE therex including quad sets, hip abduction, heel slides, and LAQ x 15 reps each on L and R for functional strengthening to aid with overall mobility. Independent with bed mobility. Used wedge in supine due to pain in sternum.   Problem solved and practiced sit <> stands. Pt managing brace independently. Attempted sit <> stand with RW from mat table but pt with pain in LLE and unable to maintain standing but for a few seconds. Pt able to get brace locked in standing but difficulty with maintaining full extension. Attempted sit > stand from highly elevated surface and brace already prelocked into extension but despite multiple attempts pt unable to complete especially with NWB status on the R. Educated pt this would be difficult for anyone due to leverage and  body mechanics. Recommending using pain level as guide for whether doing w/c level transfers or sit <> stands with RW.    Therapy Documentation Precautions:  Precautions Precautions: Fall Precaution Booklet Issued: Yes (comment) Precaution Comments: per PA, [name], no sternal precautions Required Braces or Orthoses: Other Brace, Knee Immobilizer - Right Knee Immobilizer - Right:  (KI for comfort) Other Brace: L hinged knee brace to lock in extension once in standing per MD order for transfers/gait Restrictions Weight Bearing Restrictions: Yes RLE Weight Bearing: Non weight bearing LLE Weight Bearing: Weight bearing as tolerated Other Position/Activity Restrictions: LLE WBAT with L hinged knee brace locked in extension in standing  Pain: Pain in LLE and sternum/ribs. Premedicated. Pt did not rate. Increased with activity and LLE limiting standing tolerance today.    Therapy/Group: Individual Therapy  Karolee Stamps Darrol Poke, PT, DPT, CBIS  10/02/2021, 11:43 AM

## 2021-10-02 NOTE — Progress Notes (Addendum)
Occupational Therapy Session Note  Patient Details  Name: Katie Robertson MRN: [id number] Date of Birth: Dec 28, 1990  Today's Date: 10/02/2021 OT Individual Time: 4098-1191 OT Individual Time Calculation (min): 57 min    Short Term Goals: Week 1:  OT Short Term Goal 1 (Week 1): LTG=STG 2.2 ELOS  Skilled Therapeutic Interventions/Progress Updates:  Skilled OT intervention completed with focus on DME/home management education, LB dressing, AE education, functional sit > stands. Pt received seated in w/c with Scott from [employer] in room. Pt being fitted for bledsoe brace, with therapist assisting in discussing functionality and stands and when to lock brace. Pt completed LB dressing with use of reacher, with supervision for threading, following demonstration and cues by therapist. Pt completed UB dressing with set up A. Extensive conversation with pt about her d/c plans to home with her mother to handicap accessible bathroom, but pt back and forth between there or home to her house with less accessible set up but 35 yo daughter available vs min disabled parents. Discussed pros/cons and TTB that would be needed for tub shower and push up toilet rails if going home to her house. No DME for home with her mother. Informed pt we would discuss with care team tomorrow in conference also to confirm plans. Pt attempted sit > stands with therapist blocking R foot to promote WB adherence, with pt using one hand to push up on w/c and one hand on RW, however pt with increased difficulty at mod A. Pt able to complete with fading assist at min A, but total A needed for locking extension brace while in standing, and pt very unsteady in standing. Pt reported she feels uneasy and like she has to put weight on the RLE. Pt emotional this session with the challenge, with therapist providing emotional support. Pt left seated in w/c, with nurse in room for meds, and all immediate needs met at care handoff.   Therapy  Documentation Precautions:  Precautions Precautions: Fall Precaution Booklet Issued: Yes (comment) Precaution Comments: per PA, [name], no sternal precautions Required Braces or Orthoses: Other Brace, Knee Immobilizer - Right Knee Immobilizer - Right:  (KI for comfort) Other Brace: L hinged knee brace to lock in extension once in standing per MD order for transfers/gait Restrictions Weight Bearing Restrictions: Yes RLE Weight Bearing: Non weight bearing LLE Weight Bearing: Weight bearing as tolerated Other Position/Activity Restrictions: LLE WBAT with L hinged knee brace locked in extension in standing  Pain: Unrated pain in BLEs, nurse in room to provide mes   Therapy/Group: Individual Therapy  Kedrick Mcnamee E Luria Rosario 10/02/2021, 7:28 AM

## 2021-10-02 NOTE — Progress Notes (Signed)
Orthopedic Tech Progress Note Patient Details:  Katie Robertson 10/15/1990 [id number]  Called in order to [employer] for a BLEDSOE BRACE  Patient ID: Katie Robertson, female   DOB: Feb 15, 1991, 31 y.o.   MRN: [id number]  [name] 10/02/2021, 8:12 AM

## 2021-10-02 NOTE — Progress Notes (Signed)
Received call from circulation desk on first floor, said pt is downstairs and not sure where she is supposed to be. Located pt who was outside by [employer]. Brought pt back to room, informed patient that was supposed to have family with her. Patient continued to repeat that had a grounds pass. Explained to patient that was informed by several staff that must have family or staff with her. Called P. Love, PA, pt no longer has a ground pass.

## 2021-10-02 NOTE — Progress Notes (Signed)
Occupational Therapy Session Note  Patient Details  Name: Katie Robertson MRN: [id number] Date of Birth: 1991/07/16  Today's Date: 10/02/2021 OT Individual Time: 0800-0830 OT Individual Time Calculation (min): 30 min    Short Term Goals: Week 1:  OT Short Term Goal 1 (Week 1): LTG=STG 2.2 ELOS  Skilled Therapeutic Interventions/Progress Updates:    Pt using BSC upn arrival. Hygiene and clothing mgmt with supervision using lateral leans. BLE NWB this morning. Scoot tranfser BSC to EOB (questionable pt's ability to maintain NWB). SB provided and pt performed SB transfer to w/c with CGA. Pt completed grooming tasks seated in w/c at sink. Pt remained in w/c with all needs within reach.   Therapy Documentation Precautions:  Precautions Precautions: Fall Precaution Booklet Issued: Yes (comment) Precaution Comments: per PA, [name], no sternal precautions Required Braces or Orthoses: Knee Immobilizer - Right Knee Immobilizer - Right: Other (comment) (for comfort) Restrictions Weight Bearing Restrictions: Yes RLE Weight Bearing: Non weight bearing LLE Weight Bearing: Weight bearing as tolerated   Pain:  Pt reports sternal pain; premedicated   Therapy/Group: Individual Therapy  Rich Brave 10/02/2021, 9:16 AM
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
## 2021-10-03 MED ORDER — MAGNESIUM HYDROXIDE 400 MG/5ML PO SUSP
5.0000 mL | Freq: Every day | ORAL | Status: DC
  Administered 2021-10-03 – 2021-10-04 (×2): 5 mL via ORAL
  Filled 2021-10-03 (×2): qty 30
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
## 2021-10-03 NOTE — Progress Notes (Signed)
Patient ID: Katie Robertson, female   DOB: 06/10/1991, 31 y.o.   MRN: [id number] Team Conference Report to Patient/Family  Team Conference discussion was reviewed with the patient and caregiver, including goals, any changes in plan of care and target discharge date.  Patient and caregiver express understanding and are in agreement.  The patient has a target discharge date of 10/09/21.  Sw met with patient and provided conference updates. Patient still in discussion with family about ramp vs. Bumping up steps in WC.  Patient reports she is aware of WC restrictions and plans to stay at [facility] at home.   Dyanne Iha 10/03/2021, 2:21 PM

## 2021-10-03 NOTE — Progress Notes (Signed)
Physical Therapy Session Note  Patient Details  Name: Katie Robertson MRN: [id number] Date of Birth: September 23, 1990  Today's Date: 10/03/2021 PT Individual Time: 1000-1100 PT Individual Time Calculation (min): 60 min   Short Term Goals: Week 1:  PT Short Term Goal 1 (Week 1): STG=LTG due to LOS  Skilled Therapeutic Interventions/Progress Updates:     Patient in w/c with her parents in the room upon PT arrival. Patient alert and agreeable to PT session. Patient reported 7-8/10 L rib and L knee pain during session, RN made aware. PT provided repositioning, rest breaks, and distraction as pain interventions throughout session.   Patient requested to get dressed in her personal clothing. Performed upper and lower body dressing with set-up assist, use of reacher, and cues for locking w/c breaks during lateral leans to bring pants over her hips for safety. Doffed paper scrubs and donned bra, tank top, t-shirt, and yoga pants. Patient without L Bledsoe brace donned. Educated on MD orders stating patient to have the brace donned at all timed except during showers. Educated on risks with brace off at night due to position changes, patient stated understanding.   Patient performed lateral scoot w/c<>bed with supervision and cues for removing the arm rest for increased safety. Provided cues for placement and sequencing of donning brace with hinged placed on either side of her knee joint. Adjusted straps for improved fit. Noted increased lateral bowing at the hinge joint, CPO contacted about brace during session.   Spent increased time discussing home set-up and stair management with patient and her father. Have 2 STE onto the porch with a banister at the top without rails. Discussed options for bumping up on her bottom using a step stool to a chair at the top of the steps, will pass on discussion to lead PT and continue to explore options.  Patient in w/c with her parents in the room at end of session with  breaks locked, seat belt alarm set, and all needs within reach.   Therapy Documentation Precautions:  Precautions Precautions: Fall Precaution Booklet Issued: Yes (comment) Precaution Comments: per PA, [name], no sternal precautions Required Braces or Orthoses: Other Brace, Knee Immobilizer - Right Knee Immobilizer - Right:  (KI for comfort) Other Brace: L hinged knee brace to lock in extension once in standing per MD order for transfers/gait Restrictions Weight Bearing Restrictions: Yes RLE Weight Bearing: Non weight bearing LLE Weight Bearing: Weight bearing as tolerated Other Position/Activity Restrictions: LLE WBAT with L hinged knee brace locked in extension in standing    Therapy/Group: Individual Therapy  Malyn Aytes L Yuji Walth PT, DPT  10/03/2021, 12:57 PM
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
## 2021-10-03 NOTE — Progress Notes (Signed)
Occupational Therapy Session Note  Patient Details  Name: Katie Robertson MRN: [id number] Date of Birth: [birth year]  Today's Date: 10/03/2021 OT Individual Time: 1779-3903 & 0092-3300 OT Individual Time Calculation (min): 54 min & 25 min   Short Term Goals: Week 1:  OT Short Term Goal 1 (Week 1): LTG=STG 2.2 ELOS  Skilled Therapeutic Interventions/Progress Updates:  Session 1 Skilled OT intervention completed with focus on d/c planning, home management education, functional transfers and ADL retraining. Pt received upright in bed, c/o 8/10 pain in ribs with therapist notifying nurse for pt to receive meds at start of session. Discussed with pt her CLOF and perhaps using lateral scoots vs stands at this point to increase her independence at home as well as due to instability and pain in the L knee. Discussed this therapists recommendation of sponge bathing vs stand pivoting to get into her mother's shower due to inability to lateral scoot from w/c due to shower door blocking built in bench access. Also recommended using shower tray for hair washing with some assist, and potentially HHOT to help give suggestions on environmental set up for safe functioning. Pt ultimately agreeable to all suggestions however will discuss with care team today. Pt completed bed mobility with mod I, then lateral scoot from EOB to w/c > L with supervision. Pt completed all bathing/underarm shaving and dressing with supervision at the seated level utilizing lateral leans and reacher for LB dressing. Therapist re-braided her hair to prevent matts per request. Pt was left seated in w/c, with belt alarm on due to pt's wandering and updated pt on having grounds pass revoked, and all needs in reach at end of session.  Session 2 Skilled OT intervention completed with focus on BUE exercise to promote endurance needed for lateral scoot transfers and self-care. Pt with no c/o pain. Pt received upright in bed, with Scott from [employer]  applying new bledsoe brace to LLE. Pt completed bed mobility with supervision, then lateral scoot to R with supervision to w/c. While seated in w/c, pt participated in the following exercises with 4 pound dowel:  Chest presses 4x20 Overhead presses 4x20 Bicep curls 4x20 Shoulder raises 3x20  Pt required cues for form and technique and rest breaks due to fatigue. Therapist utilized music for overall increased arousal and motivation for in room activity which pt really enjoyed. Pt left seated in w/c with belt alarm on and all needs in reach at end of session.    Therapy Documentation Precautions:  Precautions Precautions: Fall Precaution Booklet Issued: Yes (comment) Precaution Comments: per PA, [name], no sternal precautions Required Braces or Orthoses: Other Brace, Knee Immobilizer - Right Knee Immobilizer - Right:  (KI for comfort) Other Brace: L hinged knee brace to lock in extension once in standing per MD order for transfers/gait Restrictions Weight Bearing Restrictions: Yes RLE Weight Bearing: Non weight bearing LLE Weight Bearing: Weight bearing as tolerated Other Position/Activity Restrictions: LLE WBAT with L hinged knee brace locked in extension in standing    Therapy/Group: Individual Therapy  Katie Robertson E Katie Robertson 10/03/2021, 7:26 AM
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
## 2021-10-03 NOTE — Progress Notes (Signed)
Patient ID: Katie Robertson, female   DOB: [birth year], 31 y.o.   MRN: [id number]  Wheelchair, TTB, and [employer] Ordered through [employer].   [place], [place] [phone]

## 2021-10-04 DIAGNOSIS — T1490XA Injury, unspecified, initial encounter: Secondary | ICD-10-CM | POA: Diagnosis not present

## 2021-10-04 MED ORDER — DULOXETINE HCL 20 MG PO CPEP
20.0000 mg | ORAL_CAPSULE | Freq: Every day | ORAL | Status: DC
  Administered 2021-10-04 – 2021-10-09 (×6): 20 mg via ORAL
  Filled 2021-10-04 (×7): qty 1

## 2021-10-04 MED ORDER — MAGNESIUM HYDROXIDE 400 MG/5ML PO SUSP
15.0000 mL | Freq: Every day | ORAL | Status: DC
  Administered 2021-10-05 – 2021-10-08 (×3): 15 mL via ORAL
  Filled 2021-10-04 (×5): qty 30

## 2021-10-04 NOTE — Progress Notes (Signed)
Patient ID: Katie Robertson, female   DOB: March 06, 1991, 31 y.o.   MRN: [id number]  Ramp resources provided to pt.

## 2021-10-04 NOTE — Progress Notes (Signed)
Physical Therapy Session Note  Patient Details  Name: Katie Robertson MRN: [id number] Date of Birth: 1991-03-03  Today's Date: 10/04/2021 PT Individual Time: 0900-0959 PT Individual Time Calculation (min): 59 min   Short Term Goals: Week 1:  PT Short Term Goal 1 (Week 1): STG=LTG due to LOS Week 2:  PT Short Term Goal 1 (Week 2): STG=LTG due to extended LOS Week 3:     Skilled Therapeutic Interventions/Progress Updates:  Pt initially seated on edge of bed, no brace donned, eating breakfast.  Rates pain as 7-8/10  Discussed need for brace at all times.  Instructed w/quad sets w/Leg extended, 10 sec hold for extension ROM, heel propped on rolled towel to facilitate extension. Repeated x 10  Supine knee flexion w/5 sec hold at end range x 10 Pt donned Bledsoe w/cueing for alignment  Bed to wc lateral scoot w/set up assist and supervision. Pt propels to sink and from wc level brushes teeth, applies deodorant, etc independently.  Wc propulsion >474ft including mult turns mod I. Pt instructed w/technique for ascending/descending ramp then completes x 2 w/cues, cga for safety.  Wc to mat lateral scoot w/reminder to lock brakes and flip back armrest, assist to stabilize wc.  Seated calf strech on red foam wedge 3x 2 min w/intermittent AROM ankle DF w/10 sec hold at end range.  Mat to wc as above. Wc propulsion mod I to room.  Pt left oob in wc awaiting next session beginning in minutes.  Therapy Documentation Precautions:  Precautions Precautions: Fall Precaution Booklet Issued: Yes (comment) Precaution Comments: per PA, [name], no sternal precautions Required Braces or Orthoses: Other Brace, Knee Immobilizer - Right Knee Immobilizer - Right:  (KI for comfort) Other Brace: L hinged knee brace to lock in extension once in standing per MD order for transfers/gait Restrictions Weight Bearing Restrictions: Yes RLE Weight Bearing: Non weight bearing LLE Weight Bearing: Weight bearing  as tolerated Other Position/Activity Restrictions: LLE WBAT with L hinged knee brace locked in extension in standing    Therapy/Group: Individual Therapy Rada Hay, PT   Shearon Balo 10/04/2021, 12:16 PM

## 2021-10-04 NOTE — Progress Notes (Signed)
Occupational Therapy Session Note  Patient Details  Name: Katie Robertson MRN: [id number] Date of Birth: 1990-09-20  Today's Date: 10/04/2021 OT Individual Time: 1415-1500 OT Individual Time Calculation (min): 45 min  and Today's Date: 10/04/2021 OT Missed Time: 15 Minutes Missed Time Reason: Other (comment) (on a phonecall, patient request)  Short Term Goals: Week 1:  OT Short Term Goal 1 (Week 1): LTG=STG 2.2 ELOS  Skilled Therapeutic Interventions/Progress Updates:    Pt greeted seated in wc on the phone, pt requested OT return in 15 minutes after conversation. OT returned and pt agreeable to treatment session. Pt requested to wash her hair today. Educated on where she can purchase a hair washing tray. Also discussed shower transfer options in home environment. Pt able to maneuver wc to back up to the sink while OT assisted with hair washing task. Pt then spent time brushing out hair and performing grooming tasks mod I. OT then assisted with braiding hair to keep hair from getting tangled. Pt left seated in wc at end of session with needs met.   Therapy Documentation Precautions:  Precautions Precautions: Fall Precaution Booklet Issued: Yes (comment) Precaution Comments: per PA, [name], no sternal precautions Required Braces or Orthoses: Other Brace, Knee Immobilizer - Right Knee Immobilizer - Right:  (KI for comfort) Other Brace: L hinged knee brace to lock in extension once in standing per MD order for transfers/gait Restrictions Weight Bearing Restrictions: Yes RLE Weight Bearing: Non weight bearing LLE Weight Bearing: Weight bearing as tolerated Other Position/Activity Restrictions: LLE WBAT with L hinged knee brace locked in extension in standing Pain:  Denies pain   Therapy/Group: Individual Therapy  Valma Cava 10/04/2021, 2:44 PM

## 2021-10-04 NOTE — Progress Notes (Signed)
PROGRESS NOTE   Subjective/Complaints: Continues to have soreness in chest and le Tolerating therapy well She asks whether this soreness is normal She had BM last night   ROS: neg SOB, Neg N/V/D, constipation, +left knee pain, +sternal fracture pain, +rib fracture pain   Objective:   No results found. No results for input(s): WBC, HGB, HCT, PLT in the last 72 hours.  No results for input(s): NA, K, CL, CO2, GLUCOSE, BUN, CREATININE, CALCIUM in the last 72 hours.   Intake/Output Summary (Last 24 hours) at 10/04/2021 1250 Last data filed at 10/04/2021 0800 Gross per 24 hour  Intake 480 ml  Output --  Net 480 ml        Physical Exam: Vital Signs Blood pressure 123/72, pulse 91, temperature 98 F (36.7 C), temperature source Oral, resp. rate 18, height 5' 7.5" (1.715 m), weight 92.8 kg, SpO2 98 %.  General: No acute distress, BMI 31.57 Mood and affect are appropriate Heart: tachycardic Lungs: Clear to auscultation, breathing unlabored, no rales or wheezes, left ant rib pain increased with deep breath  Abdomen: Positive bowel sounds, soft nontender to palpation, nondistended Extremities: No clubbing, cyanosis, or edema Skin: No evidence of breakdown, no evidence of rash Neurologic/MSK: Cranial nerves II through XII intact, motor strength is 5/5 in bilateral deltoid, bicep, tricep, grip,3/5 hip flexor, knee extensors, Left ankle dorsiflexor and plantar flexor, RLE in [facility]- well fitting    Comments: Right ankle splinted--toes numb.  Right knee immobilizer in place Able to propel wheelchair through hallways    Mental Status: She is alert and oriented to person, place, and time.   Assessment/Plan: 1. Functional deficits which require 3+ hours per day of interdisciplinary therapy in a comprehensive inpatient rehab setting. Physiatrist is providing close team supervision and 24 hour management of active medical problems  listed below. Physiatrist and rehab team continue to assess barriers to discharge/monitor patient progress toward functional and medical goals  Care Tool:  Bathing    Body parts bathed by patient: Right arm, Left arm, Chest, Abdomen, Face, Front perineal area, Buttocks, Right upper leg, Left upper leg, Left lower leg   Body parts bathed by helper: Left lower leg Body parts n/a: Right lower leg (cast)   Bathing assist Assist Level: Set up assist     Upper Body Dressing/Undressing Upper body dressing   What is the patient wearing?: Pull over shirt    Upper body assist Assist Level: Independent with assistive device    Lower Body Dressing/Undressing Lower body dressing      What is the patient wearing?: Pants     Lower body assist Assist for lower body dressing: Supervision/Verbal cueing     Toileting Toileting    Toileting assist Assist for toileting: 2 Helpers     Transfers Chair/bed transfer  Transfers assist     Chair/bed transfer assist level: Contact Guard/Touching assist (lateral scoot)     Locomotion Ambulation   Ambulation assist      Assist level: Contact Guard/Touching assist Assistive device: Walker-rolling Max distance: 48   Walk 10 feet activity   Assist     Assist level: Contact Guard/Touching assist Assistive device: Walker-rolling  Walk 50 feet activity   Assist    Assist level: Contact Guard/Touching assist Assistive device: Walker-rolling    Walk 150 feet activity   Assist Walk 150 feet activity did not occur: Safety/medical concerns (chest pain, weakness, fatigue, altered balance strategies)         Walk 10 feet on uneven surface  activity   Assist Walk 10 feet on uneven surfaces activity did not occur: Safety/medical concerns (chest pain, weakness, fatigue, altered balance strategies)         Wheelchair     Assist Is the patient using a wheelchair?: Yes Type of Wheelchair: Manual    Wheelchair  assist level: Independent Max wheelchair distance: >199ft    Wheelchair 50 feet with 2 turns activity    Assist        Assist Level: Independent   Wheelchair 150 feet activity     Assist      Assist Level: Independent   Blood pressure 123/72, pulse 91, temperature 98 F (36.7 C), temperature source Oral, resp. rate 18, height 5' 7.5" (1.715 m), weight 92.8 kg, SpO2 98 %.  Medical Problem List and Plan: 1. Functional deficits secondary to polytrauma             -patient may shower             -ELOS/Goals: 8 days  Continue CIR  Family asking about outp vs HH therapy   Discussed ortho follow-up 2.  Antithrombotics: -DVT/anticoagulation:  Pharmaceutical: Lovenox             -antiplatelet therapy: N/A 3. Pain from rib and sternal fractures: Add fentanyl patch.  Add lidocaine patch to ribs and to sternum. Start Cymbalta 20mg  daily.  --Continue oxycodone 15 mg as needed for breakthrough pain --Decrease Tylenol to 650 mg 4 times daily.   --Continue Neurontin 300 mg and Robaxin 100 mg 3 times daily. --Tramadol 4 times daily was added 02/15 4. Mood: LCSW for evaluation and to provide support.               -antipsychotic agents: N/A 5. Neuropsych: This patient is capable of making decisions on her own behalf. 6. Skin/Wound Care: Monitor wound for healing. Protein supplements and vitamins added to promote healing.  7. Fluids/Electrolytes/Nutrition: Monitor I/O. Check CMET in am. 8.  Right trimalleolar ankle fracture s/p ORIF: NWB LLE with splint in place. Check vitamin D level tomorrow morning. 9.  Right tibial plateau fracture: Knee immobilizer on RLE for comfort only. 10.  Left tibial eminence fracture: WBAT LLE. CT today to assess for soft tissue injury given level of pain. To place in [facility] on Monday 11.  Bilateral rib fractures/sternal fractures: Lidocaine patch as well as ice for local measures. 12.  Pyelonephritis: Continue cefdinir to complete antibiotic  course 13.  ABLA: continue to monitor hemoglobin 14.  Constipation: managed on current regimen. Discussed with her that magnesium level is normal. Changed senna to 2 tabs HS and colace to 100mg  TID. Continue current regimen. Add magnesium gluconate 250mg  HS. Increase milk of magnesia to 32mL daily 15. Leukocytosis: resolved, monitor weekly  16. Vitamin D deficiency: start ergocalciferol 50,000U once per week for 7 weeks.  17. Tachycardic: add magnesium gluconate 250mg  HS. Increase milk of magnesia to 75mL daily.   LOS: 8 days A FACE TO FACE EVALUATION WAS PERFORMED  Clide Deutscher Elishia Kaczorowski 10/04/2021, 12:50 PM

## 2021-10-04 NOTE — Progress Notes (Signed)
Physical Therapy Weekly Progress Note  Patient Details  Name: Chelesea Weiand MRN: [id number] Date of Birth: 1991-02-19  Beginning of progress report period: September 27, 2021 End of progress report period: October 04, 2021  Today's Date: 10/04/2021 PT Individual Time: 1000-1053 PT Individual Time Calculation (min): 53 min   Patient has met 3 of 6 long term goals. Pt's LOS was extended due to change in status/goals to Mod I WC level when pt began to have increased pain with standing. Pt is now in Bledsoe brace for LLE that can be unlocked when transferring sit<>stand, then immediately locked into extension upon standing. Pt is currently able to perform bed mobility mod I, lateral scoot transfers with supervision, simulated car transfers with supervision (via lateral scoot), and WC mobility >124f mod I. Pt continues to be limited by pain in chest/sternum and in LLE, generalized weakness, and difficulty standing due to RLE NWB precautions and pain in L knee when standing with Bledsoe brace locked in extension. Pt is working on getting temporary ramp [facility] [employer].   Patient continues to demonstrate the following deficits muscle weakness and muscle joint tightness, decreased cardiorespiratoy endurance, and decreased standing balance, decreased postural control, decreased balance strategies, and difficulty maintaining precautions and therefore will continue to benefit from skilled PT intervention to increase functional independence with mobility.  Patient not progressing toward long term goals.  See goal revision..  Plan of care revisions: [facility] to increased pain associated with standing activities.  PT Short Term Goals Week 1:  PT Short Term Goal 1 (Week 1): STG=LTG due to LOS Week 2:  PT Short Term Goal 1 (Week 2): STG=LTG due to extended LOS  Skilled Therapeutic Interventions/Progress Updates:  Ambulation/gait training;Discharge  planning;Functional mobility training;Psychosocial support;Therapeutic Activities;Visual/perceptual remediation/compensation;Balance/vestibular training;Disease management/prevention;Neuromuscular re-education;Skin care/wound management;Therapeutic Exercise;Wheelchair propulsion/positioning;DME/adaptive equipment instruction;Pain management;Splinting/orthotics;UE/LE Strength taining/ROM;Community reintegration;Functional electrical stimulation;Patient/family education;Stair training;UE/LE Coordination activities   Today's Interventions: Received pt sitting in WC, pt agreeable to PT treatment, and continues to report pain 7/10 in L knee and chest (premedicated) - provided ice pack at end of session. Session with emphasis on functional mobility/transfers, simulated car transfers, and generalized strengthening and endurance. Pt performed WC mobility >3072fusing BUE mod I to 26M ortho gym. Pt performed simulated car transfer via lateral scoot with supervision with therapist stabilizing WC - cues for scooting technique and positioning of LEs to maintain RLE NWB precautions. Pt performed BUE strengthening on UBE at level 5 alternating 1 minute forward and 1 minute backwards for a total of 6 minutes. Re[facility] out to [employer] to provide pt with recourses for temporary ramp. Discussed getting HHPT initially upon discharge with the hopes of transitioning to OPPT once WB restrictions are lifted. Pt performed WC mobility additional >30054fsing BUE mod I to dayroom and performed LLE strengthening on Kinetron at 20 cm/sec for 1 minute x 4 trials with therapist providing manual counter resistance on trial 1 but then requesting to perform on her own for remainder - used dowel to provide counter resistance- emphasis on quad/glute strength. Noted tightness along hamstrings/gastroc on LLE; demonstrated seated calf stretch and pt performed using gait 4x20 second hold. Then worked on WC [facility] skills in obstacle course consisting  of propelling backwards for [id number] weaving in/out of cones [id number] emphasis on navigating tight turns/corners - pt only hitting 2/8 cones. Returned to room and concluded session [facility] needs within reach.  Therapy Documentation Precautions:  Precautions Precautions: Fall Precaution Booklet Issued: Yes (comment) Precaution Comments: per PA, [name], no sternal precautions Required Braces or Orthoses: Other Brace, Knee Immobilizer - Right Knee Immobilizer - Right:  (KI for comfort) Other Brace: L hinged knee brace to lock in extension once in standing per MD order for transfers/gait Restrictions Weight Bearing Restrictions: Yes RLE Weight Bearing: Non weight bearing LLE Weight Bearing: Weight bearing as tolerated Other Position/Activity Restrictions: LLE WBAT with L hinged knee brace locked in extension in standing  Therapy/Group: Individual Therapy Alfonse Alpers PT, DPT  10/04/2021, 7:23 AM

## 2021-10-04 NOTE — Plan of Care (Signed)
°  Problem: RH Balance Goal: LTG Patient will maintain dynamic standing balance (PT) Description: LTG:  Patient will maintain dynamic standing balance with assistance during mobility activities (PT) Outcome: Not Applicable Flowsheets (Taken 10/04/2021 0720) LTG: Pt will maintain dynamic standing balance during mobility activities with:: (D/C) --   Problem: RH Ambulation Goal: LTG Patient will ambulate in controlled environment (PT) Description: LTG: Patient will ambulate in a controlled environment, # of feet with assistance (PT). Outcome: Not Applicable Flowsheets (Taken 10/04/2021 0720) LTG: Pt will ambulate in controlled environ  assist needed:: (D/C) -- Note: D/C Goal: LTG Patient will ambulate in home environment (PT) Description: LTG: Patient will ambulate in home environment, # of feet with assistance (PT). Outcome: Not Applicable Flowsheets (Taken 10/04/2021 0720) LTG: Pt will ambulate in home environ  assist needed:: (D/C) -- Note: D/C   Problem: Sit to Stand Goal: LTG:  Patient will perform sit to stand with assistance level (PT) Description: LTG:  Patient will perform sit to stand with assistance level (PT) Flowsheets (Taken 10/04/2021 0720) LTG: PT will perform sit to stand in preparation for functional mobility with assistance level: (downgraded due to pain, weakness, altered balance strategies) Contact Guard/Touching assist Note: downgraded due to pain, weakness, altered balance strategies

## 2021-10-05 ENCOUNTER — Inpatient Hospital Stay (HOSPITAL_COMMUNITY): Payer: Medicaid Other

## 2021-10-05 NOTE — Progress Notes (Signed)
Physical Therapy Session Note  Patient Details  Name: Katie Robertson MRN: [id number] Date of Birth: 04/30/1991  Today's Date: 10/05/2021 PT Individual Time: 3536-1443 PT Individual Time Calculation (min): 64 min  Today's Date: 10/05/2021 PT Missed Time: 11 Minutes Missed Time Reason: Patient fatigue;Pain  Short Term Goals: Week 1:  PT Short Term Goal 1 (Week 1): STG=LTG due to LOS Week 2:  PT Short Term Goal 1 (Week 2): STG=LTG due to extended LOS  Skilled Therapeutic Interventions/Progress Updates:   Received pt sitting on commode with NT present - PT took over with care. Pt agreeable to PT treatment and reported pain 7/10 in L knee and sternum (premedicated). Session with emphasis on functional mobility/transfers, generalized strengthening and ROM, dynamic standing balance/coordination, gait training, and improved activity tolerance. Pt continent of bladder and able to manage clothing with supervision and transferred bedside commode<>WC via lateral scoot with supervision. Pt performed WC mobility 123ft using BUE mod I to therapy gym and transferred WC<>mat via lateral scoot with supervision. Raised mat and locked Bledsoe brace into extension. Discussed ramp options but pt reporting that the temporary ramp price is too expensive - looked up portable ramp options on [employer] with pt planning on purchasing ramp and WC gloves tonight. Sit<>stand with RW from elevated mat with min A fading to supervision x 5 reps using mirror for visual feedback. Once standing pt performed R knee flexion 2x10 reps to fatigue. Returned to sitting and performed seated hamstring stretch on LLE 5x20 second hold. Sit<>stand with RW and supervision and tossed horseshoes using LUE x 4 trials with 1 UE support and close supervision for balance - pt demonstrating slight TDWB rather than NWB on RLE. Pt then ambulated 35ft with RW and CGA - limited by fatigue and chest pain and demonstrating slight RLE TDWB rather than NWB on RLE.  Worked on UE strength and WC mobility tying grn TB around pt and having her propel WC forward 66ft x 2 trials against resistance provided by therapist. Pt performed WC mobility 19ft using BUE mod I back to room. Concluded session with pt sitting in [facility] with all needs within reach. RN present at bedside providing pain medications. 11 minutes missed of skilled physical therapy due to pain/fatigue.   Therapy Documentation Precautions:  Precautions Precautions: Fall Precaution Booklet Issued: Yes (comment) Precaution Comments: per PA, [name], no sternal precautions Required Braces or Orthoses: Other Brace, Knee Immobilizer - Right Knee Immobilizer - Right:  (KI for comfort) Other Brace: L hinged knee brace to lock in extension once in standing per MD order for transfers/gait Restrictions Weight Bearing Restrictions: Yes RLE Weight Bearing: Non weight bearing LLE Weight Bearing: Weight bearing as tolerated Other Position/Activity Restrictions: LLE WBAT with L hinged knee brace locked in extension in standing  Therapy/Group: Individual Therapy Martin Majestic PT, DPT   10/05/2021, 7:37 AM

## 2021-10-05 NOTE — Progress Notes (Signed)
Occupational Therapy Session Note  Patient Details  Name: Katie Robertson MRN: [id number] Date of Birth: 12/12/1990  Today's Date: 10/05/2021 OT Individual Time: 1300-1355 OT Individual Time Calculation (min): 55 min    Short Term Goals: Week 1:  OT Short Term Goal 1 (Week 1): LTG=STG 2.2 ELOS  Skilled Therapeutic Interventions/Progress Updates:    Pt in w/c with 6/10 pain, reports this is constant and she is premedicated. She completed w/c propulsion to the therapy gym, 125 ft, with mod I. She completed a lateral scoot to the mat with (S). She completed several stands from the [facility] with L knee immobilizer locked. Placed a block under her RLE to prevent further TDWB. Pt required CGA-min A from very elevated mat to compensate for KI in locked position. Once standing pt was able to remain standing for up to 20 seconds. Discussed potential use of a PRAFO for her L dorsiflexion tightness- consulted with PT as well. She then completed core strengthening exercises with trunk rotation and posterior lean 3x20 repetitions. She then completed BUE strengthening circuit, rows, tricep pull down, and lat pull down with a level 2 resistance band and resistance also adjusted by OT as needed. 3x10 repetitions. Pt returned to her w/c and was left sitting up in the w/c with all needs met.   Therapy Documentation Precautions:  Precautions Precautions: Fall Precaution Booklet Issued: Yes (comment) Precaution Comments: per PA, [name], no sternal precautions Required Braces or Orthoses: Other Brace, Knee Immobilizer - Right Knee Immobilizer - Right:  (KI for comfort) Other Brace: L hinged knee brace to lock in extension once in standing per MD order for transfers/gait Restrictions Weight Bearing Restrictions: Yes RLE Weight Bearing: Non weight bearing LLE Weight Bearing: Weight bearing as tolerated Other Position/Activity Restrictions: LLE WBAT with L hinged knee brace locked in extension in  standing   Therapy/Group: Individual Therapy  Curtis Sites 10/05/2021, 6:35 AM
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
## 2021-10-05 NOTE — Progress Notes (Signed)
Pt and Pt' family member was educated on visiting hours. Verbalized understanding. Pt had very late(at 23:00) visitors.Visitors stayed  about 30 min, boyfriend requested to stay over.
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
## 2021-10-05 NOTE — Progress Notes (Signed)
Occupational Therapy Session Note  Patient Details  Name: Kamrynn Melott MRN: [id number] Date of Birth: [birth year]  Today's Date: 10/05/2021 OT Individual Time: 8366-2947 OT Individual Time Calculation (min): 55 min    Short Term Goals: Week 1:  OT Short Term Goal 1 (Week 1): LTG=STG 2.2 ELOS  Skilled Therapeutic Interventions/Progress Updates:  Skilled OT intervention completed with focus on family education with pt's mother and step father present, toilet transfers, self-care and home management education, BUE endurance. Pt received seated upright in bed with family present. Pt expressing concern about how her knee won't get completely straight in stance with the brace with education provided to pt on purpose of the brace for pt's knee stability (laterally and ant/post) vs to keep the knee locked in -10 degrees extension per the brace measurement.  Pt and family arguing about place of d/c, with pt stating I can go home to my house, however her mother discussing pt's lack of assist and inaccessible environment. This therapist provided education to both pt and family about the recommendation of d/c home with pt's parents due to most accessible environment, already has all equipment needed, and will have 24/7 support for higher level tasks. Pt eventually stating she understood the pros and was agreeable.  Pt completed toilet transfer from w/c to regular toilet to better simulate bathroom at her parents, with supervision required for stabilization of w/c and for safety cues. Pt used grab bar for lateral scoot. [name] present observing to engage in how pt should go to toilet and several strategies discussed to increase efficiency and safety upon d/c. While seated at sink, pt able to complete all bathing/dressing tasks with supervision via lateral leans, with safety cues needed to lock her w/c, on several accounts. Pt able to donn her bledsoe brace with cues for efficiency only. Pt self-propelled  in w/c to vending machine with supervision for community management education and BUE endurance, retrieved her items, and then returned back to room where pt was left seated in w/c with family present, chair alarm on and all needs in reach at end of session.   Therapy Documentation Precautions:  Precautions Precautions: Fall Precaution Booklet Issued: Yes (comment) Precaution Comments: per PA, [name], no sternal precautions Required Braces or Orthoses: Other Brace, Knee Immobilizer - Right Knee Immobilizer - Right:  (KI for comfort) Other Brace: L hinged knee brace to lock in extension once in standing per MD order for transfers/gait Restrictions Weight Bearing Restrictions: Yes RLE Weight Bearing: Non weight bearing LLE Weight Bearing: Weight bearing as tolerated Other Position/Activity Restrictions: LLE WBAT with L hinged knee brace locked in extension in standing  Pain: No c/o pain   Therapy/Group: Individual Therapy  Mert Dietrick E Jonasia Coiner 10/05/2021, 7:36 AM
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
## 2021-10-05 NOTE — Progress Notes (Signed)
Occupational Therapy Weekly Progress Note  Patient Details  Name: Katie Robertson MRN: [id number] Date of Birth: [birth year]  Beginning of progress report period: September 27, 2021 End of progress report period: October 05, 2021  Pt has met 5 of 8 LTGs. Pt is making steady progress towards remaining LTGs. Pt's LOS was extended due to change in status/goals to Mod I WC level when pt began to have increased pain with standing.Pt has progressed functional transfers from mod A stand pivot to lateral scoots to various surfaces with supervision, is able to bathe, dress and toilet at an overall supervision level. Pt continues to be demonstrate difficulty with functional sit > stands that promote pt's efficiency with standing ADLs, however has improved her safety awareness with multiple transfers including to regular toilet. Family completed education regarding self-care tasks and toilet transfers, demonstrating appropriate supervision assist during tasks.  Patient continues to demonstrate the following deficits: muscle weakness and muscle joint tightness, decreased cardiorespiratoy endurance, decreased coordination, and decreased standing balance, decreased postural control, and difficulty maintaining precautions and therefore will continue to benefit from skilled OT intervention to enhance overall performance with BADL.  Patient progressing toward long term goals..  Continue plan of care.    [name] E [name] 10/05/2021, 3:58 PM

## 2021-10-06 DIAGNOSIS — R000 Tachycardia, unspecified: Secondary | ICD-10-CM

## 2021-10-06 DIAGNOSIS — D62 Acute posthemorrhagic anemia: Secondary | ICD-10-CM

## 2021-10-06 DIAGNOSIS — R52 Pain, unspecified: Secondary | ICD-10-CM
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
## 2021-10-07 NOTE — Progress Notes (Signed)
Orthopedic Tech Progress Note Patient Details:  Katie Robertson 09-Apr-1991 [id number]  Ortho Devices Type of Ortho Device: Prafo boot/shoe Ortho Device/Splint Location: delivered to room Ortho Device/Splint Interventions: Application, Adjustment, Ordered   Post Interventions Patient Tolerated: Well Instructions Provided: Care of device, Adjustment of device  Trinna Post 10/07/2021, 9:46 PM

## 2021-10-07 NOTE — Progress Notes (Signed)
Physical Therapy Session Note  Patient Details  Name: Katie Robertson MRN: [id number] Date of Birth: [birth year]  Today's Date: 10/07/2021 PT Individual Time: 0900-0958 PT Individual Time Calculation (min): 58 min   Short Term Goals: Week 1:  PT Short Term Goal 1 (Week 1): STG=LTG due to LOS Week 2:  PT Short Term Goal 1 (Week 2): STG=LTG due to extended LOS  Skilled Therapeutic Interventions/Progress Updates:  Tx 1:  Pt sitting up in bed.  She c/o migraine HA 9/10 pain, premedicated.  She was willing to participate. PT provided pt with ice pack, which she placed on the back of her neck.  Scoot transfer laterally bed> wc, with cues for set up, close supervision.  Pt donned L Bledsoe brace once sitting in wc, with cues for proper placement.  Pt reported that her L calf is always sore and very tight.  PT educated pt on stretching with/without use of strap.  Pt performed self stretching L heel cords x 30 seconds, x 3 seated in wc with strap.  Strengthening: 25 x 1 R hip flexion, 20 x 1 L ankle DG, 20 x 2 L ankle PF using Kinetron unilaterally from sitting in wc, use of shuttle on cord for back and bil UE strengthening, while sitting in wc with bil armests flipped back.   Sit>< stand with min assist/CGA to RW, Bledsoe locked, to stand x 2, less than 10 seconds q bout.  Pt declined to attempt gait training due to migraine.  At end of session, pt seated in wc with needs at hand.  She verbalized that she would ask for help when she decided to get back into bed.  Tx 2:  Pt deeply asleep.  She awakened to  name.  Pt reported that her migraine pain is still 9/10, and politely declined tx.     Therapy Documentation Precautions:  Precautions Precautions: Fall Precaution Booklet Issued: Yes (comment) Precaution Comments: per PA, [name], no sternal precautions Required Braces or Orthoses: Other Brace, Knee Immobilizer - Right Knee Immobilizer - Right:  (KI for comfort) Other Brace: L hinged knee  brace to lock in extension once in standing per MD order for transfers/gait Restrictions Weight Bearing Restrictions: Yes RLE Weight Bearing: Non weight bearing LLE Weight Bearing: Weight bearing as tolerated Other Position/Activity Restrictions: LLE WBAT with L hinged knee brace locked in extension in standing General:missed 30 min PT due to illness        Therapy/Group: Individual Therapy  Imani Sherrin 10/07/2021, 10:09 AM

## 2021-10-08 ENCOUNTER — Other Ambulatory Visit (HOSPITAL_COMMUNITY): Payer: Self-pay

## 2021-10-08 ENCOUNTER — Inpatient Hospital Stay (HOSPITAL_COMMUNITY): Payer: Medicaid Other

## 2021-10-08 LAB — BASIC METABOLIC PANEL
Anion gap: 7 (ref 5–15)
BUN: 14 mg/dL (ref 6–20)
CO2: 31 mmol/L (ref 22–32)
Calcium: 8.9 mg/dL (ref 8.9–10.3)
Chloride: 101 mmol/L (ref 98–111)
Creatinine, Ser: 0.88 mg/dL (ref 0.44–1.00)
GFR, Estimated: >60 mL/min (ref >60)
Glucose, Bld: 126 mg/dL — ABNORMAL HIGH (ref 70–99)
Potassium: 3.7 mmol/L (ref 3.5–5.1)
Sodium: 139 mmol/L (ref 135–145)

## 2021-10-08 LAB — CBC
HCT: 29.7 % — ABNORMAL LOW (ref 36.0–46.0)
Hemoglobin: 9.5 g/dL — ABNORMAL LOW (ref 12.0–15.0)
MCH: 28.2 pg (ref 26.0–34.0)
MCHC: 32 g/dL (ref 30.0–36.0)
MCV: 88.1 fL (ref 80.0–100.0)
Platelets: 366 10*3/uL (ref 150–400)
RBC: 3.37 MIL/uL — ABNORMAL LOW (ref 3.87–5.11)
RDW: 13.6 % (ref 11.5–15.5)
WBC: 5.9 10*3/uL (ref 4.0–10.5)
nRBC: 0 % (ref 0.0–0.2)

## 2021-10-08 MED ORDER — LIDOCAINE 5 % EX PTCH
2.0000 | MEDICATED_PATCH | CUTANEOUS | 0 refills | Status: DC
Start: 2021-10-08 — End: 2021-11-15
  Filled 2021-10-08: qty 30, 15d supply, fill #0

## 2021-10-08 MED ORDER — DULOXETINE HCL 20 MG PO CPEP
20.0000 mg | ORAL_CAPSULE | Freq: Every day | ORAL | 1 refills | Status: DC
Start: 2021-10-09 — End: 2021-10-16
  Filled 2021-10-08: qty 30, 30d supply, fill #0

## 2021-10-08 MED ORDER — ASPIRIN 325 MG PO TBEC
325.0000 mg | DELAYED_RELEASE_TABLET | Freq: Every day | ORAL | 1 refills | Status: DC
  Filled 2021-10-08: qty 60, 60d supply, fill #0

## 2021-10-08 MED ORDER — OXYCODONE HCL ER 15 MG PO T12A
15.0000 mg | EXTENDED_RELEASE_TABLET | Freq: Every day | ORAL | Status: DC
  Administered 2021-10-09: 15 mg via ORAL
  Filled 2021-10-08: qty 1

## 2021-10-08 MED ORDER — TRAMADOL HCL 50 MG PO TABS
50.0000 mg | ORAL_TABLET | Freq: Four times a day (QID) | ORAL | 0 refills | Status: DC | PRN
  Filled 2021-10-08: qty 28, 7d supply, fill #0

## 2021-10-08 MED ORDER — ACETAMINOPHEN 325 MG PO TABS
650.0000 mg | ORAL_TABLET | Freq: Three times a day (TID) | ORAL | 0 refills | Status: DC
  Filled 2021-10-08: qty 60, 8d supply, fill #0

## 2021-10-08 MED ORDER — SENNOSIDES-DOCUSATE SODIUM 8.6-50 MG PO TABS
2.0000 | ORAL_TABLET | Freq: Two times a day (BID) | ORAL | 0 refills | Status: DC
  Filled 2021-10-08: qty 120, 30d supply, fill #0

## 2021-10-08 MED ORDER — FENTANYL 12 MCG/HR TD PT72
1.0000 | MEDICATED_PATCH | TRANSDERMAL | 0 refills | Status: DC
  Filled 2021-10-08: qty 5, 15d supply, fill #0

## 2021-10-08 MED ORDER — GABAPENTIN 400 MG PO CAPS
400.0000 mg | ORAL_CAPSULE | Freq: Three times a day (TID) | ORAL | 0 refills | Status: DC
  Filled 2021-10-08: qty 90, 30d supply, fill #0

## 2021-10-08 MED ORDER — DOCUSATE SODIUM 100 MG PO CAPS
100.0000 mg | ORAL_CAPSULE | Freq: Every day | ORAL | 0 refills | Status: DC
  Filled 2021-10-08: qty 30, 30d supply, fill #0

## 2021-10-08 MED ORDER — VITAMIN D (ERGOCALCIFEROL) 1.25 MG (50000 UNIT) PO CAPS
50000.0000 [IU] | ORAL_CAPSULE | ORAL | 0 refills | Status: DC
  Filled 2021-10-08: qty 5, 35d supply, fill #0

## 2021-10-08 MED ORDER — OXYCODONE HCL 15 MG PO TABS
15.0000 mg | ORAL_TABLET | Freq: Four times a day (QID) | ORAL | 0 refills | Status: DC | PRN
  Filled 2021-10-08: qty 28, 7d supply, fill #0

## 2021-10-08 MED ORDER — MAGNESIUM GLUCONATE 500 MG PO TABS
500.0000 mg | ORAL_TABLET | Freq: Every day | ORAL | Status: DC
  Administered 2021-10-08: 500 mg via ORAL
  Filled 2021-10-08: qty 1

## 2021-10-08 MED ORDER — POLYETHYLENE GLYCOL 3350 17 GM/SCOOP PO POWD
17.0000 g | Freq: Every day | ORAL | 0 refills | Status: DC
  Filled 2021-10-08: qty 238, 14d supply, fill #0

## 2021-10-08 MED ORDER — METHOCARBAMOL 500 MG PO TABS
1000.0000 mg | ORAL_TABLET | Freq: Three times a day (TID) | ORAL | 0 refills | Status: DC
  Filled 2021-10-08: qty 180, 30d supply, fill #0

## 2021-10-08 MED ORDER — MAGNESIUM OXIDE 400 MG PO TABS
ORAL_TABLET | Freq: Every day | ORAL | 0 refills | Status: DC
  Filled 2021-10-08: qty 30, 30d supply, fill #0

## 2021-10-08 MED ORDER — SALINE NASAL SPRAY 0.65 % NA SOLN
1.0000 | Freq: Three times a day (TID) | NASAL | 0 refills | Status: DC
  Filled 2021-10-08: qty 44, 30d supply, fill #0

## 2021-10-08 MED ORDER — NICOTINE 7 MG/24HR TD PT24
7.0000 mg | MEDICATED_PATCH | Freq: Every day | TRANSDERMAL | 0 refills | Status: DC
  Filled 2021-10-08: qty 28, 28d supply, fill #0

## 2021-10-08 MED ORDER — VITAMIN D3 25 MCG PO TABS
2000.0000 [IU] | ORAL_TABLET | Freq: Every day | ORAL | 0 refills | Status: DC
Start: 2021-10-09 — End: 2022-04-19
  Filled 2021-10-08: qty 60, 30d supply, fill #0

## 2021-10-08 MED ORDER — QUETIAPINE FUMARATE 50 MG PO TABS
50.0000 mg | ORAL_TABLET | Freq: Every day | ORAL | 0 refills | Status: DC
  Filled 2021-10-08: qty 30, 30d supply, fill #0

## 2021-10-08 NOTE — Progress Notes (Signed)
Patient requesting to go off department with sister to get coffee, on call notified and request denied, patient informed and assigned nurse.

## 2021-10-08 NOTE — Progress Notes (Signed)
Physical Therapy Session Note  Patient Details  Name: Katie Robertson MRN: [id number] Date of Birth: March 13, 1991  Today's Date: 10/08/2021 PT Individual Time: 0831-0859 PT Individual Time Calculation (min): 28 min   Short Term Goals: Week 2:  PT Short Term Goal 1 (Week 2): STG=LTG due to extended LOS  Skilled Therapeutic Interventions/Progress Updates:     Pt received supine in bed and agrees to therapy. Reports headache pain. Number not provided. Reports having taken pain meds prior to therapy. PT provides rest breaks to manage pain. Pt performs bed mobility independently. Bed to [facility] transfer mod(I) with pop over technique toward R side. Pt dons shoe independently. Supervision required for correct donning of bledsoe brace with cues for optimal positioning. Pt self propels WC to gym, x300', with bilateral upper extremities. Pt performs car transfer with squat pivot technique and verbal cues for positioning and sequencing. Pt self propels WC back to room. Left seated with all needs within reach.  Therapy Documentation Precautions:  Precautions Precautions: Fall Precaution Booklet Issued: Yes (comment) Precaution Comments: per PA, [name], no sternal precautions Required Braces or Orthoses: Other Brace, Knee Immobilizer - Right Knee Immobilizer - Right:  (KI for comfort) Other Brace: L hinged knee brace to lock in extension once in standing per MD order for transfers/gait Restrictions Weight Bearing Restrictions: Yes RLE Weight Bearing: Non weight bearing LLE Weight Bearing: Weight bearing as tolerated Other Position/Activity Restrictions: LLE WBAT with L hinged knee brace locked in extension in standing    Therapy/Group: Individual Therapy  Beau Fanny, PT, DPT 10/08/2021, 4:01 PM

## 2021-10-08 NOTE — Progress Notes (Signed)
Orthopedic Tech Progress Note Patient Details:  Katie Robertson [birth year] [id number]  Ortho Devices Type of Ortho Device: CAM walker Ortho Device/Splint Location: RLE Ortho Device/Splint Interventions: Ordered, Application   Post Interventions Patient Tolerated: Well Instructions Provided: Care of device  [name] 10/08/2021, 10:43 AM

## 2021-10-08 NOTE — Progress Notes (Addendum)
Patient ID: Katie Robertson, female   DOB: 08/19/1990, 31 y.o.   MRN: [id number]  Sw made attempt to set patient up with PCP at [facility]. Left VM. SW will continue to follow up   Facility not accepting new Medicaid patients.

## 2021-10-08 NOTE — Progress Notes (Signed)
Physical Therapy Discharge Summary  Patient Details  Name: Katie Robertson MRN: [id number] Date of Birth: May 26, 1991  Today's Date: 10/08/2021 PT Individual Time: 5621-3086 PT Individual Time Calculation (min): 47 min  Today's Date: 10/08/2021 PT Missed Time: 13 Minutes Missed Time Reason: Other (Comment) (phone call)  Patient has met 6 of 6 long term goals due to improved activity tolerance, improved balance, improved postural control, increased strength, increased range of motion, ability to compensate for deficits, and improved coordination. Patient to discharge at a wheelchair level Modified Independent. Patient's care partner is independent to provide the necessary physical assistance at discharge.  All goals met   Recommendation:  Patient will benefit from ongoing skilled PT services in home health setting initially, eventually switching to OPPT once WB restrictions have been lifted to continue to advance safe functional mobility, address ongoing impairments in transfers, generalized strengthening and ROM, dynamic standing balance/coordination, gait training, endurance, and to minimize fall risk.  Equipment: 18x18 manual WC with R elevating legrest, RW  Reasons for discharge: treatment goals met  Patient/family agrees with progress made and goals achieved: Yes  Today's Interventions: Received pt sitting in WC on the phone, requesting a few minutes to finish phone call. Upon therapist's return pt agreeable to PT treatment, and reported pain 7/10 in chest and in L leg (premedicated). Session with emphasis on discharge planning, functional mobility/transfers, generalized strengthening and endurance, and dynamic standing balance/coordination. Noted 20x16 WC delivered to room but pt reported her 18x18 WC was delivered to her mom's house already - notified CSW. Doffed R CAM boot, donned R sock, and donned CAM boot with min A. Pt reported she ordered a WC ramp but it won't be delivered until  Friday. Pt stated she doesn't think anyone would be available to bump her up/down stairs - suggested ambulance transport home and reached out to [place] for further details. Pt performed WC mobility 158f using BUE and mod I to dayroom. Pt performed LLE strengthening on Kinetron at 10 cm/sec for 1 minute x 4 trials using 5lb dowel to provide counter resistance - emphasis on glute/quad strength. Then worked on blocked practice sit<>stands with RW and CGA/close supervision x 5 reps - pt demonstrating slight TDWB on RLE rather than NWB. Pt also asking questions about method for navigating steps with shower chair - demonstrated this technique again but suggested pt avoid doing this unless she has a friend who can provide mod A (as her parents aren't in good enough health). Returned to room and [facility]. Provided pt with snack. 13 minutes missed of skilled physical therapy due to pt taking phone call.   PT Discharge Precautions/Restrictions Precautions Precautions: Fall Precaution Comments: per PA, [name], no sternal precautions Required Braces or Orthoses: Other Brace;Knee Immobilizer - Right Other Brace: L hinged knee brace to lock in extension once in standing per MD order for transfers/gait Restrictions Weight Bearing Restrictions: Yes RLE Weight Bearing: Non weight bearing LLE Weight Bearing: Weight bearing as tolerated Other Position/Activity Restrictions: LLE WBAT with L hinged knee brace locked in extension in standing Pain Interference Pain Interference Pain Effect on Sleep: 4. Almost constantly Pain Interference with Therapy Activities: 1. Rarely or not at all Pain Interference with Day-to-Day Activities: 1. Rarely or not at all Cognition Overall Cognitive Status: Within Functional Limits for tasks assessed Arousal/Alertness: Awake/alert Orientation Level: Oriented X4 Memory: Appears intact Awareness: Appears intact Problem Solving:  Appears intact Safety/Judgment: Appears intact Sensation Sensation Light  Touch: Appears Intact Proprioception: Appears Intact Additional Comments: numbness along toes of RLE Coordination Gross Motor Movements are Fluid and Coordinated: No Fine Motor Movements are Fluid and Coordinated: Yes Coordination and Movement Description: grossly uncoordinated due to altered balance strategies 2/2 RLE NWB precautions and L hinged brace locked into extension once in standing, pain in sternum, and generalized weakness Finger Nose Finger Test: [facility] bilaterally Heel Shin Test: decreased ROM bilaterally due to CAM boot and Bledsoe brace Motor  Motor Motor: Abnormal postural alignment and control Motor - Skilled Clinical Observations: grossly uncoordinated due to altered balance strategies 2/2 RLE NWB precautions and L hinged brace locked into extension once in standing, pain in sternum, and generalized weakness  Mobility Bed Mobility Bed Mobility: Rolling Right;Rolling Left;Sit to Supine;Supine to Sit Rolling Right: Independent with assistive device Rolling Left: Independent with assistive device Supine to Sit: Independent with assistive device Sit to Supine: Independent with assistive device Transfers Transfers: Sit to Stand;Stand to Sit;Stand Pivot Transfers Sit to Stand: Supervision/Verbal cueing Stand to Sit: Supervision/Verbal cueing Stand Pivot Transfers: Contact Guard/Touching assist Transfer (Assistive device): Rolling walker Locomotion  Gait Ambulation: Yes Gait Assistance: Contact Guard/Touching assist Gait Distance (Feet): 20 Feet Assistive device: Rolling walker Gait Assistance Details: Verbal cues for gait pattern;Verbal cues for precautions/safety Gait Assistance Details: verbal cues for adherance to RLE NWB precautions and energy conservation techniques Gait Gait: Yes Gait Pattern: Impaired Gait Pattern: Step-to pattern;Decreased step length - left;Decreased stride  length;Antalgic;Poor foot clearance - left Gait velocity: decreased Stairs / Additional Locomotion Stairs: Yes Stairs Assistance: Moderate Assistance - Patient 50 - 74% Stair Management Technique: Other (comment);One rail Left (shower chair) Number of Stairs: 4 Height of Stairs: 6 Wheelchair Mobility Wheelchair Mobility: Yes Wheelchair Assistance: Independent with Camera operator: Both upper extremities Wheelchair Parts Management: Independent Distance: >138f  Trunk/Postural Assessment  Cervical Assessment Cervical Assessment: Within Functional Limits Thoracic Assessment Thoracic Assessment: Within Functional Limits Lumbar Assessment Lumbar Assessment: Ex[facility] to [facility](mild posterior pelvic tilt) Postural Control Postural Control: Within Functional Limits  Balance Balance Balance Assessed: Yes Static Sitting Balance Static Sitting - Balance Support: Feet supported;Bilateral upper extremity supported Static Sitting - Level of Assistance: 7: Independent Dynamic Sitting Balance Dynamic Sitting - Balance Support: Feet supported;No upper extremity supported Dynamic Sitting - Level of Assistance: 6: Modified independent (Device/Increase time) Static Standing Balance Static Standing - Balance Support: Bilateral upper extremity supported (RW) Static Standing - Level of Assistance: 5: Stand by assistance (supervision) Dynamic Standing Balance Dynamic Standing - Balance Support: Bilateral upper extremity supported (RW) Dynamic Standing - Level of Assistance: 5: Stand by assistance (CGA) Dynamic Standing - Comments: with gait Extremity Assessment  RLE Assessment RLE Assessment: Exceptions to WThe [facility]CRLE Strength Right Hip Flexion: 4-/5 Right Hip ABduction: 4-/5 Right Hip ADduction: 4-/5 Right Knee Flexion: 3+/5 Right Knee Extension: 3+/5 LLE Assessment LLE Assessment: Exceptions to [facility] Strength Left Hip Flexion: 4-/5 Left Hip ABduction: 4-/5 Left  Hip ADduction: 4-/5 Left Knee Flexion: 4-/5 Left Knee Extension: 3+/5 Left Ankle Dorsiflexion: 4-/5 Left Ankle Plantar Flexion: 4-/5  AAlfonse AlpersPT, DPT  10/08/2021, 7:43 AM

## 2021-10-08 NOTE — Progress Notes (Signed)
Physical Therapy Session Note  Patient Details  Name: Katie Robertson MRN: [id number] Date of Birth: 1991-01-08  Today's Date: 10/08/2021 PT Individual Time: 1115-1200 PT Individual Time Calculation (min): 45 min   Short Term Goals: Week 1:  PT Short Term Goal 1 (Week 1): STG=LTG due to LOS Week 2:  PT Short Term Goal 1 (Week 2): STG=LTG due to extended LOS  Skilled Therapeutic Interventions/Progress Updates:    Patient received sitting up in wc, agreeable to PT. She reports 8/10 migraine, premedicated. PT providing rest breaks, distractions and repositioning to assist with pain management. Patient requesting to have CAM boot put on. PT donning CAM boot TotalA. Patient able to don Bledsoe brace herself with PT guidance for correct positioning. She propelled herself ModI to dayroom. Patient completing lateral scoot transfer ModI to mat table. X5 sit <> stand from elevated mat height and initially MinA fading to supervision and RW. Patient continues to report stretch in L calf. PT providing prolonged stretch to calf x3. Patient completing x5 sit <> stand from lower mat height and supervision. Patient reporting less of a stretch than prior. ModI lateral scoot back to her wc and patient returned to her room in wc. Needs within reach.   Therapy Documentation Precautions:  Precautions Precautions: Fall Precaution Booklet Issued: Yes (comment) Precaution Comments: per PA, [name], no sternal precautions Required Braces or Orthoses: Other Brace, Knee Immobilizer - Right Knee Immobilizer - Right:  (KI for comfort) Other Brace: L hinged knee brace to lock in extension once in standing per MD order for transfers/gait Restrictions Weight Bearing Restrictions: Yes RLE Weight Bearing: Non weight bearing LLE Weight Bearing: Weight bearing as tolerated Other Position/Activity Restrictions: LLE WBAT with L hinged knee brace locked in extension in standing    Therapy/Group: Individual Therapy  Elizebeth Koller, PT, DPT, CBIS  10/08/2021, 7:58 AM

## 2021-10-08 NOTE — Progress Notes (Signed)
Occupational Therapy Discharge Summary  Patient Details  Name: Katie Robertson MRN: [id number] Date of Birth: 1990/09/04   Patient has met 6 of 8 long term goals due to improved activity tolerance, improved balance, postural control, ability to compensate for deficits, improved awareness, and improved coordination.  Patient to discharge at overall mod I for seated ADLs and supervision level for toilet/shower transfers.  Patient is to d/c home with pt's mother and step father, and both are able to provide the necessary intermittent supervision assistance at discharge.    Reasons goals not met: standing and dynamic standing balance goals not met at mod I level due to pt's increased pain, decreased balance and difficulty maintaining weight bearing precautions. Pt unable to meet at a mod I level, but has met up to a supervision level and is adequate for d/c  Recommendation:  No f/u OT  Equipment: [name] has BSC and built in shower bench at her home  Reasons for discharge: treatment goals met  Patient/family agrees with progress made and goals achieved: Yes  OT Discharge Precautions/Restrictions  Precautions Precautions: Fall Precaution Comments: per PA, [name], no sternal precautions Required Braces or Orthoses: Other Brace;Knee Immobilizer - Right Other Brace: L hinged knee brace to lock in extension once in standing per MD order for transfers/gait Restrictions Weight Bearing Restrictions: Yes RLE Weight Bearing: Non weight bearing LLE Weight Bearing: Weight bearing as tolerated Other Position/Activity Restrictions: LLE WBAT with L hinged knee brace locked in extension in standing ADL ADL Eating: Independent Where Assessed-Eating: Wheelchair Grooming: Modified independent Where Assessed-Grooming: Sitting at sink, Wheelchair Upper Body Bathing: Modified independent Where Assessed-Upper Body Bathing: Wheelchair, Sitting at sink Lower Body Bathing: Supervision/safety Where  Assessed-Lower Body Bathing: Sitting at sink, Wheelchair Upper Body Dressing: Independent Where Assessed-Upper Body Dressing: Sitting at sink, Wheelchair Lower Body Dressing: Modified independent Where Assessed-Lower Body Dressing: Sitting at sink, Wheelchair Toileting: Supervision/safety Where Assessed-Toileting: Glass blower/designer: Close supervision Toilet Transfer Method: Other (comment) (lateral scoot) Science writer: Grab bars, Raised toilet seat Tub/Shower Transfer: Not assessed Tub/Shower Equipment: Transfer tub bench, Walk in shower, Energy manager: Close supervision Social research officer, government Method: Other (comment) (lateral scoot) Youth worker: Gaffer Baseline Vision/History: 0 No visual deficits Patient Visual Report: No change from baseline Perception  Perception: Within Functional Limits Praxis Praxis: Intact Cognition Overall Cognitive Status: Within Functional Limits for tasks assessed Arousal/Alertness: Awake/alert Orientation Level: Oriented X4 Year: 2023 Month: February Day of Week: Correct Memory: Appears intact Immediate Memory Recall: Blue;Sock;Bed Memory Recall Sock: Without Cue Memory Recall Blue: Without Cue Memory Recall Bed: Without Cue Awareness: Appears intact Problem Solving: Appears intact Safety/Judgment: Appears intact Sensation Sensation Light Touch: Appears Intact Proprioception: Appears Intact Coordination Gross Motor Movements are Fluid and Coordinated: No Fine Motor Movements are Fluid and Coordinated: Yes Coordination and Movement Description: chest pain, and fatigue/weakness Finger Nose Finger Test: [facility] bilaterally Motor  Motor Motor: Abnormal postural alignment and control Motor - Skilled Clinical Observations: grossly uncoordinated due to altered balance strategies 2/2 RLE NWB precautions and L hinged brace locked into extension once in standing, pain in sternum, and  generalized weakness Mobility  Bed Mobility Bed Mobility: Rolling Right;Rolling Left;Sit to Supine;Supine to Sit Rolling Right: Independent with assistive device Rolling Left: Independent with assistive device Supine to Sit: Independent with assistive device Sit to Supine: Independent with assistive device Transfers Sit to Stand: Supervision/Verbal cueing Stand to Sit: Supervision/Verbal cueing  Trunk/Postural Assessment  Cervical Assessment Cervical Assessment: Within  Functional Limits Thoracic Assessment Thoracic Assessment: Within Functional Limits Lumbar Assessment Lumbar Assessment: Exceptions to [facility] (mild posterior pelvic tilt) Postural Control Postural Control: Within Functional Limits  Balance Balance Balance Assessed: Yes Static Sitting Balance Static Sitting - Balance Support: Feet supported;Bilateral upper extremity supported Static Sitting - Level of Assistance: 7: Independent Dynamic Sitting Balance Dynamic Sitting - Balance Support: Feet supported;No upper extremity supported Dynamic Sitting - Level of Assistance: 6: Modified independent (Device/Increase time) Static Standing Balance Static Standing - Balance Support: Bilateral upper extremity supported (RW) Static Standing - Level of Assistance: 5: Stand by assistance (supervision) Dynamic Standing Balance Dynamic Standing - Balance Support: Bilateral upper extremity supported (RW) Dynamic Standing - Level of Assistance: 5: Stand by assistance (CGA) Dynamic Standing - Comments: with gait Extremity/Trunk Assessment RUE Assessment RUE Assessment: Within Functional Limits LUE Assessment LUE Assessment: Within Functional Limits   Lakeith Careaga E Nena Hampe 10/08/2021, 7:57 AM

## 2021-10-08 NOTE — Progress Notes (Signed)
Occupational Therapy Session Note  Patient Details  Name: Rosalie Buenaventura MRN: [id number] Date of Birth: May 17, 1991  Today's Date: 10/08/2021 OT Individual Time: 1000-1055 OT Individual Time Calculation (min): 55 min    Short Term Goals: Week 2:  OT Short Term Goal 1 (Week 2): LTG = STG due to ELOS  Skilled Therapeutic Interventions/Progress Updates:  Skilled OT intervention completed with focus on d/c planning, review of rehab goals and POC, self-care and functional transfers. Pt received seated in w/c, agreeable to session, requesting to take a shower. Pt was able to gather all items to set up for shower with supervision. Shower transfer via lateral scoot from w/c <> tub bench completed with supervision. Pt's RLE cast had been removed, but bandages were just placed so therapist covered leg with bag/tape, and pt able to complete all doffing of clothing/braces with supervision, bathe seated only with supervision and donn UB shirt with independence and LB pants with mod I. Education provided to pt on how to don CAM boot that was delivered. While seated at sink, pt completed grooming tasks with mod I, then therapist braided hair per pt request to preserve hair integrity.   Note- therapist found e-cig wadded up in pt's clothing, with this therapist informing pt of the no smoking policy in the hospital/campus with pt stating "oh just ignore it, pretend it's not there," with pt taking e-cig an stuffing it into her bra. This therapist notified her nurse of the item found, with nurse in room at end of session to confiscate. Pt was left seated in w/c, with chair alarm on and all needs in reach at end of session.   Therapy Documentation Precautions:  Precautions Precautions: Fall Precaution Booklet Issued: Yes (comment) Precaution Comments: per PA, [name], no sternal precautions Required Braces or Orthoses: Other Brace, Knee Immobilizer - Right Knee Immobilizer - Right:  (KI for comfort) Other Brace:  L hinged knee brace to lock in extension once in standing per MD order for transfers/gait Restrictions Weight Bearing Restrictions: Yes RLE Weight Bearing: Non weight bearing LLE Weight Bearing: Weight bearing as tolerated Other Position/Activity Restrictions: LLE WBAT with L hinged knee brace locked in extension in standing  Pain: No c/o pain   Therapy/Group: Individual Therapy  Diontay Rosencrans E Oddie Kuhlmann 10/08/2021, 7:33 AM
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
## 2021-10-08 NOTE — Progress Notes (Signed)
Inpatient Rehabilitation Discharge Medication Review by a Pharmacist  A complete drug regimen review was completed for this patient to identify any potential clinically significant medication issues.  High Risk Drug Classes Is patient taking? Indication by Medication  Antipsychotic Yes Seroquel- sleep  Anticoagulant No   Antibiotic No   Opioid Yes OxyIR, tramadol, fentanyl patch- acute pain s/p ortho surgery  Antiplatelet Yes Aspirin- s/p ortho prophylaxis x30 days at discharge  Hypoglycemics/insulin No   Vasoactive Medication No   Chemotherapy No   Other Yes Cymbalta, gabapentin, lidoderm- neuropathic pain Robaxin- muscle spasms     Type of Medication Issue Identified Description of Issue Recommendation(s)  Drug Interaction(s) (clinically significant)     Duplicate Therapy     Allergy     No Medication Administration End Date     Incorrect Dose     Additional Drug Therapy Needed     Significant med changes from prior encounter (inform family/care partners about these prior to discharge).    Other       Clinically significant medication issues were identified that warrant physician communication and completion of prescribed/recommended actions by midnight of the next day:  No  Time spent performing this drug regimen review (minutes):  30   Lee Kuang BS, PharmD, BCPS Clinical Pharmacist 10/09/2021 9:18 AM
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
## 2021-10-09 ENCOUNTER — Other Ambulatory Visit (HOSPITAL_COMMUNITY): Payer: Self-pay

## 2021-10-09 MED ORDER — TRAZODONE HCL 50 MG PO TABS
25.0000 mg | ORAL_TABLET | Freq: Every evening | ORAL | 0 refills | Status: DC | PRN
  Filled 2021-10-09: qty 30, 30d supply, fill #0

## 2021-10-09 MED ORDER — OXYCODONE HCL 10 MG PO TABS
10.0000 mg | ORAL_TABLET | Freq: Four times a day (QID) | ORAL | 0 refills | Status: DC | PRN
  Filled 2021-10-09: qty 28, 7d supply, fill #0

## 2021-10-09 MED ORDER — FENTANYL 12 MCG/HR TD PT72
1.0000 | MEDICATED_PATCH | TRANSDERMAL | Status: DC
  Administered 2021-10-09: 1 via TRANSDERMAL
  Filled 2021-10-09: qty 1

## 2021-10-09 MED ORDER — ASPIRIN 325 MG PO TBEC
325.0000 mg | DELAYED_RELEASE_TABLET | Freq: Two times a day (BID) | ORAL | 1 refills | Status: DC
  Filled 2021-10-09 (×2): qty 60, 30d supply, fill #0

## 2021-10-09 NOTE — TOC Benefit Eligibility Note (Addendum)
Patient Advocate Encounter   Received notification that prior authorization for fentanyl (Duragesic) 12 mcg/hr is required.   PA submitted on 10/09/2021 Confirmation# for [facility] Medicaid [id number] W Status is pending       Lyndel Safe, [place] Patient Advocate Specialist [place] Patient Advocate Team Direct Number: [phone]  Fax: [phone]

## 2021-10-09 NOTE — Progress Notes (Signed)
Patient ID: Katie Robertson, female   DOB: Dec 03, 1990, 31 y.o.   MRN: [id number]  SW will provide patient with information on how to establish PCP with insurance.

## 2021-10-09 NOTE — TOC Benefit Eligibility Note (Signed)
Patient Advocate Encounter  Received notification that the request for prior authorization for [facility] 12 mcg/hr patches has been denied due to patient not being in pain for more than 28 days.         Roland Earl, CPhT Pharmacy Patient Advocate Specialist [facility] Patient Advocate Team Direct Number: [phone]  Fax: [phone]
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
## 2021-10-09 NOTE — Progress Notes (Signed)
Patient ID: Katie Robertson, female   DOB: [birth year], 31 y.o.   MRN: [id number]  Patient discharge address: [address] [name], [place] [address] PTAR scheduled 12:30PM

## 2021-10-10 DIAGNOSIS — S82131A Displaced fracture of medial condyle of right tibia, initial encounter for closed fracture: Secondary | ICD-10-CM

## 2021-10-10 DIAGNOSIS — K5900 Constipation, unspecified: Secondary | ICD-10-CM

## 2021-10-10 DIAGNOSIS — G4700 Insomnia, unspecified: Secondary | ICD-10-CM

## 2021-10-10 DIAGNOSIS — G629 Polyneuropathy, unspecified: Secondary | ICD-10-CM

## 2021-10-10 NOTE — Progress Notes (Signed)
Inpatient Rehabilitation Care Coordinator Discharge Note   Patient Details  Name: Katie Robertson MRN: [id number] Date of Birth: 03-17-1991   Discharge location: Home  Length of Stay: 13 Days  Discharge activity level:    Home/community participation: mother assisting at home  Patient response [id number] Literacy - How often do you need to have someone help you when you read instructions, pamphlets, or other written material from your doctor or pharmacy?: Never  Patient response [id number] Isolation - How often do you feel lonely or isolated from those around you?: Never  Services provided included: MD, RD, PT, SLP, OT, RN, CM, TR, Pharmacy, Neuropsych  Financial Services:  Financial Services Utilized: Medicaid    Choices offered to/list presented to: n/a  Follow-up services arranged:  Other (Comment) (HEP)           Patient response to transportation need: Is the patient able to respond to transportation needs?: Yes In the past 12 months, has lack of transportation kept you from medical appointments or from getting medications?: No In the past 12 months, has lack of transportation kept you from meetings, work, or from getting things needed for daily living?: No    Comments (or additional information):  Patient/Family verbalized understanding of follow-up arrangements:  Yes  Individual responsible for coordination of the follow-up plan: patient  Confirmed correct DME delivered: Dyanne Iha 10/10/2021    Dyanne Iha

## 2021-10-11 ENCOUNTER — Telehealth: Payer: Self-pay | Admitting: *Deleted
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
## 2021-10-11 NOTE — Telephone Encounter (Signed)
Transitional Care call--I spoke with Ms Batdorf ? ? ? ?Are you/is patient experiencing any problems since coming home? Are there any questions regarding any aspect of care? NO ?Are there any questions regarding medications administration/dosing? Are meds being taken as prescribed? Patient should review meds with caller to confirm Yes she has medication. I asked that she bring the bottles and box for patches to the appt. ?Have there been any falls? NO ?Has Home Health been to the house and/or have they contacted you? If not, have you tried to contact them? Can we help you contact them? N/A ?Are bowels and bladder emptying properly? Are there any unexpected incontinence issues? If applicable, is patient following bowel/bladder programs? NO ?Any fevers, problems with breathing, unexpected pain? NO ?Are there any skin problems or new areas of breakdown? NO ?Has the patient/family member arranged specialty MD follow up (ie cardiology/neurology/renal/surgical/etc)?  Can we help arrange? She has the numbers to call for appt with Dr Louanne Belton and Dr Ulice Bold. She has her appt to see Riley Lam 10/16/21 and Dr Carlis Abbott 03/19/22 ?Does the patient need any other services or support that we can help arrange? NO ?Are caregivers following through as expected in assisting the patient? YES ?Has the patient quit smoking, drinking alcohol, or using drugs as recommended? YES ? ?Appointment Tuesday 10/16/21 @11 :00 arrive by 10:40 to see NP ?Alerted to watch for packet in mail from our office.Address reviewed ?[address]   ?

## 2021-10-16 ENCOUNTER — Telehealth: Payer: Self-pay | Admitting: *Deleted

## 2021-10-16 ENCOUNTER — Encounter: Payer: Medicaid Other | Attending: Registered Nurse | Admitting: Registered Nurse

## 2021-10-16 ENCOUNTER — Encounter: Payer: Self-pay | Admitting: Registered Nurse

## 2021-10-16 ENCOUNTER — Other Ambulatory Visit: Payer: Self-pay

## 2021-10-16 VITALS — BP 123/84 | HR 97 | Temp 98.3°F | Ht 67.5 in | Wt 204.5 lb

## 2021-10-16 DIAGNOSIS — F419 Anxiety disorder, unspecified: Secondary | ICD-10-CM | POA: Insufficient documentation

## 2021-10-16 DIAGNOSIS — S82401A Unspecified fracture of shaft of right fibula, initial encounter for closed fracture: Secondary | ICD-10-CM | POA: Diagnosis present

## 2021-10-16 DIAGNOSIS — S82201A Unspecified fracture of shaft of right tibia, initial encounter for closed fracture: Secondary | ICD-10-CM | POA: Diagnosis present

## 2021-10-16 DIAGNOSIS — T1490XA Injury, unspecified, initial encounter: Secondary | ICD-10-CM | POA: Diagnosis present

## 2021-10-16 DIAGNOSIS — G8918 Other acute postprocedural pain: Secondary | ICD-10-CM | POA: Insufficient documentation

## 2021-10-16 DIAGNOSIS — Z5181 Encounter for therapeutic drug level monitoring: Secondary | ICD-10-CM | POA: Diagnosis present

## 2021-10-16 DIAGNOSIS — Z79891 Long term (current) use of opiate analgesic: Secondary | ICD-10-CM | POA: Diagnosis present

## 2021-10-16 DIAGNOSIS — G894 Chronic pain syndrome: Secondary | ICD-10-CM | POA: Diagnosis present

## 2021-10-16 MED ORDER — TRAZODONE HCL 50 MG PO TABS: 25.0000 mg | ORAL_TABLET | Freq: Every evening | ORAL | 0 refills | Status: DC | PRN

## 2021-10-16 MED ORDER — OXYCODONE HCL 10 MG PO TABS
10.0000 mg | ORAL_TABLET | Freq: Four times a day (QID) | ORAL | 0 refills | Status: DC | PRN
Start: 2021-10-16 — End: 2021-11-29

## 2021-10-16 MED ORDER — DULOXETINE HCL 20 MG PO CPEP: 20.0000 mg | ORAL_CAPSULE | Freq: Every day | ORAL | 1 refills | Status: DC

## 2021-10-16 MED ORDER — QUETIAPINE FUMARATE 50 MG PO TABS: 50.0000 mg | ORAL_TABLET | Freq: Every day | ORAL | 0 refills | Status: DC

## 2021-10-16 MED ORDER — GABAPENTIN 400 MG PO CAPS: 400.0000 mg | ORAL_CAPSULE | Freq: Three times a day (TID) | ORAL | 0 refills | Status: DC

## 2021-10-16 MED ORDER — TRAMADOL HCL 50 MG PO TABS: 50.0000 mg | ORAL_TABLET | Freq: Four times a day (QID) | ORAL | 1 refills | Status: DC | PRN

## 2021-10-16 MED ORDER — SENNOSIDES-DOCUSATE SODIUM 8.6-50 MG PO TABS: 2.0000 | ORAL_TABLET | Freq: Two times a day (BID) | ORAL | 0 refills | Status: DC
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
## 2021-10-16 NOTE — Patient Instructions (Addendum)
Dr Suzanna Obey: Otolaryngology  ?[address]  ?[address]  ?[phone] ? ? ?Begin to slowly wean the Oxycodone :  ?The Goal Is three times a  day as needed for pain  ? ? ?My: Chart : [phone] ?

## 2021-10-16 NOTE — Telephone Encounter (Signed)
Prior auth for Tramadol and Oxycodone submitted to [employer]. ? ?Tramadol:Confirmation #:[id number] WBenefit Plan:MCAIDHealth Plan:[employer]XIX ?Prior Approval #:[id number] PA Type:PHARMACYRecipient:Katie D WALRAVENRecipient YI:[id number] RBilling Provider:Billing Provider JJ:[id number] Provider Name:EUNICE Charlean Sanfilippo Provider BZ:[id number] Submission Date:03/07/2023Status:APPROVED Effective Begin Date:03/07/2023Effective End Date:04/14/2022 ?

## 2021-10-17 ENCOUNTER — Other Ambulatory Visit (HOSPITAL_COMMUNITY): Payer: Self-pay

## 2021-10-17 NOTE — Telephone Encounter (Signed)
Oxycodone showed approved but pharmacy said it would not run. Tramadol is ready. I have resubmitted a prior auth for oxy under high dose MME because the previous Fentanyl rx made her appear to have MME>90. Await results. ?

## 2021-10-17 NOTE — Telephone Encounter (Signed)
Confirmation #:[id number] WBenefit Plan:MCAIDHealth Plan:NCXIX ?Prior Approval #:[id number] PA Type:PHARMACYRecipient:Katie D WALRAVENRecipient TX:[id number] RBilling Provider:Billing Provider YQ:[id number] Provider Name:EUNICE Charlean Sanfilippo Provider GQ:[phone] Submission Date:03/07/2023Status:APPROVED Effective Begin Date:03/07/2023Effective End Date:04/14/2022 ?

## 2021-10-18 ENCOUNTER — Telehealth: Payer: Self-pay

## 2021-10-18 NOTE — Telephone Encounter (Signed)
Received a PA for Quetiapine for patient due to her being on an opioid. Patient is no longer on Fentanyl patches and called [employer] to initiate PA. PA was approved 10/18/21-10/13/22. PA #[id number] ?

## 2021-10-19 LAB — TOXASSURE SELECT,+ANTIDEPR,UR

## 2021-10-22 ENCOUNTER — Telehealth: Payer: Self-pay | Admitting: *Deleted

## 2021-10-22 NOTE — Telephone Encounter (Signed)
Urine drug screen for this encounter is consistent for prescribed medication 

## 2021-11-15 ENCOUNTER — Encounter: Payer: Medicaid Other | Attending: Registered Nurse | Admitting: Registered Nurse

## 2021-11-15 ENCOUNTER — Encounter: Payer: Self-pay | Admitting: Registered Nurse

## 2021-11-15 VITALS — BP 120/80 | HR 100 | Ht 67.5 in | Wt 211.6 lb

## 2021-11-15 DIAGNOSIS — Z79891 Long term (current) use of opiate analgesic: Secondary | ICD-10-CM | POA: Insufficient documentation

## 2021-11-15 DIAGNOSIS — S82201A Unspecified fracture of shaft of right tibia, initial encounter for closed fracture: Secondary | ICD-10-CM | POA: Diagnosis present

## 2021-11-15 DIAGNOSIS — Z5181 Encounter for therapeutic drug level monitoring: Secondary | ICD-10-CM | POA: Insufficient documentation

## 2021-11-15 DIAGNOSIS — S82401A Unspecified fracture of shaft of right fibula, initial encounter for closed fracture: Secondary | ICD-10-CM | POA: Insufficient documentation

## 2021-11-15 DIAGNOSIS — G894 Chronic pain syndrome: Secondary | ICD-10-CM | POA: Insufficient documentation
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
## 2021-11-22 LAB — TOXASSURE SELECT,+ANTIDEPR,UR

## 2021-11-29 ENCOUNTER — Telehealth: Payer: Self-pay | Admitting: *Deleted

## 2021-11-29 NOTE — Telephone Encounter (Signed)
Urine drug screen is positive for methamphetamine. Fentanyl is present as well but pt reported she put half a fentanyl patch on which may not be the source of the fentanyl present. ?

## 2021-11-29 NOTE — Telephone Encounter (Signed)
Discharge from practice for violation of controlled substance agreement. Prescribed opioid was not in UDS and methamphetamine present. No wean down needed. Refill on tramadol cancelled at pharmacy. Letter sent through [employer] and [employer] certified mail. ?

## 2022-01-15 ENCOUNTER — Ambulatory Visit: Payer: Medicaid Other | Admitting: Registered Nurse

## 2022-02-14 ENCOUNTER — Ambulatory Visit: Payer: Medicaid Other | Admitting: Registered Nurse

## 2022-02-15 ENCOUNTER — Other Ambulatory Visit (HOSPITAL_COMMUNITY): Payer: Self-pay

## 2022-02-18 ENCOUNTER — Other Ambulatory Visit (HOSPITAL_COMMUNITY): Payer: Self-pay

## 2022-03-05 ENCOUNTER — Ambulatory Visit: Payer: Medicaid Other | Admitting: Physical Medicine and Rehabilitation

## 2022-03-19 ENCOUNTER — Ambulatory Visit: Payer: Medicaid Other | Admitting: Physical Medicine and Rehabilitation

## 2022-04-18 ENCOUNTER — Other Ambulatory Visit (HOSPITAL_COMMUNITY)
Admission: EM | Admit: 2022-04-18 | Discharge: 2022-04-19 | Disposition: A | Discharge status: Home / Self Care | Payer: Medicaid Other | Attending: Psychiatry | Admitting: Psychiatry

## 2022-04-18 DIAGNOSIS — Z20822 Contact with and (suspected) exposure to covid-19: Secondary | ICD-10-CM | POA: Diagnosis not present

## 2022-04-18 DIAGNOSIS — Z87442 Personal history of urinary calculi: Secondary | ICD-10-CM | POA: Insufficient documentation

## 2022-04-18 DIAGNOSIS — F1120 Opioid dependence, uncomplicated: Secondary | ICD-10-CM | POA: Insufficient documentation

## 2022-04-18 DIAGNOSIS — F1520 Other stimulant dependence, uncomplicated: Secondary | ICD-10-CM | POA: Insufficient documentation

## 2022-04-18 LAB — CBC WITH DIFFERENTIAL/PLATELET
Abs Immature Granulocytes: 0.03 10*3/uL (ref 0.00–0.07)
Basophils Absolute: 0 10*3/uL (ref 0.0–0.1)
Basophils Relative: 1 %
Eosinophils Absolute: 0.6 10*3/uL — ABNORMAL HIGH (ref 0.0–0.5)
Eosinophils Relative: 7 %
HCT: 39.3 % (ref 36.0–46.0)
Hemoglobin: 13.3 g/dL (ref 12.0–15.0)
Immature Granulocytes: 0 %
Lymphocytes Relative: 34 %
Lymphs Abs: 2.7 10*3/uL (ref 0.7–4.0)
MCH: 27.8 pg (ref 26.0–34.0)
MCHC: 33.8 g/dL (ref 30.0–36.0)
MCV: 82 fL (ref 80.0–100.0)
Monocytes Absolute: 0.6 10*3/uL (ref 0.1–1.0)
Monocytes Relative: 7 %
Neutro Abs: 3.9 10*3/uL (ref 1.7–7.7)
Neutrophils Relative %: 51 %
Platelets: 283 10*3/uL (ref 150–400)
RBC: 4.79 MIL/uL (ref 3.87–5.11)
RDW: 13.1 % (ref 11.5–15.5)
WBC: 7.8 10*3/uL (ref 4.0–10.5)
nRBC: 0 % (ref 0.0–0.2)

## 2022-04-18 LAB — POCT URINE DRUG SCREEN - MANUAL ENTRY (I-SCREEN)
POC Amphetamine UR: POSITIVE — AB
POC Buprenorphine (BUP): NOT DETECTED
POC Cocaine UR: NOT DETECTED
POC Marijuana UR: NOT DETECTED
POC Methadone UR: NOT DETECTED
POC Methamphetamine UR: POSITIVE — AB
POC Morphine: NOT DETECTED
POC Oxazepam (BZO): NOT DETECTED
POC Oxycodone UR: NOT DETECTED
POC Secobarbital (BAR): NOT DETECTED

## 2022-04-18 LAB — ETHANOL: Alcohol, Ethyl (B): <10 mg/dL (ref <10)

## 2022-04-18 LAB — TSH: TSH: 3.697 u[IU]/mL (ref 0.350–4.500)

## 2022-04-18 LAB — COMPREHENSIVE METABOLIC PANEL
ALT: 16 U/L (ref 0–44)
AST: 17 U/L (ref 15–41)
Albumin: 4.1 g/dL (ref 3.5–5.0)
Alkaline Phosphatase: 78 U/L (ref 38–126)
Anion gap: 7 (ref 5–15)
BUN: 12 mg/dL (ref 6–20)
CO2: 29 mmol/L (ref 22–32)
Calcium: 9.7 mg/dL (ref 8.9–10.3)
Chloride: 103 mmol/L (ref 98–111)
Creatinine, Ser: 0.8 mg/dL (ref 0.44–1.00)
GFR, Estimated: >60 mL/min (ref >60)
Glucose, Bld: 115 mg/dL — ABNORMAL HIGH (ref 70–99)
Potassium: 4.6 mmol/L (ref 3.5–5.1)
Sodium: 139 mmol/L (ref 135–145)
Total Bilirubin: 0.5 mg/dL (ref 0.3–1.2)
Total Protein: 7 g/dL (ref 6.5–8.1)

## 2022-04-18 LAB — LIPID PANEL
Cholesterol: 185 mg/dL (ref 0–200)
HDL: 36 mg/dL — ABNORMAL LOW (ref >40)
LDL Cholesterol: 121 mg/dL — ABNORMAL HIGH (ref 0–99)
Total CHOL/HDL Ratio: 5.1 RATIO
Triglycerides: 139 mg/dL (ref <150)
VLDL: 28 mg/dL (ref 0–40)

## 2022-04-18 LAB — RESP PANEL BY RT-PCR (FLU A&B, COVID) ARPGX2
Influenza A by PCR: NEGATIVE
Influenza B by PCR: NEGATIVE
SARS Coronavirus 2 by RT PCR: NEGATIVE

## 2022-04-18 LAB — MAGNESIUM: Magnesium: 2 mg/dL (ref 1.7–2.4)

## 2022-04-18 LAB — HEMOGLOBIN A1C
Hgb A1c MFr Bld: 5.3 % (ref 4.8–5.6)
Mean Plasma Glucose: 105.41 mg/dL

## 2022-04-18 MED ORDER — CLONIDINE HCL 0.1 MG PO TABS: 0.1000 mg | ORAL_TABLET | ORAL | Status: DC

## 2022-04-18 MED ORDER — LOPERAMIDE HCL 2 MG PO CAPS: 2.0000 mg | ORAL_CAPSULE | ORAL | Status: DC | PRN

## 2022-04-18 MED ORDER — NAPROXEN 500 MG PO TABS: 500.0000 mg | ORAL_TABLET | Freq: Two times a day (BID) | ORAL | Status: DC | PRN

## 2022-04-18 MED ORDER — HYDROXYZINE HCL 25 MG PO TABS
25.0000 mg | ORAL_TABLET | Freq: Three times a day (TID) | ORAL | Status: DC | PRN
  Administered 2022-04-19: 25 mg via ORAL
  Filled 2022-04-18: qty 1

## 2022-04-18 MED ORDER — CLONIDINE HCL 0.1 MG PO TABS: 0.1000 mg | ORAL_TABLET | Freq: Every day | ORAL | Status: DC

## 2022-04-18 MED ORDER — METHOCARBAMOL 500 MG PO TABS: 500.0000 mg | ORAL_TABLET | Freq: Three times a day (TID) | ORAL | Status: DC | PRN

## 2022-04-18 MED ORDER — ACETAMINOPHEN 325 MG PO TABS: 650.0000 mg | ORAL_TABLET | Freq: Four times a day (QID) | ORAL | Status: DC | PRN

## 2022-04-18 MED ORDER — NICOTINE 14 MG/24HR TD PT24
14.0000 mg | MEDICATED_PATCH | Freq: Every day | TRANSDERMAL | Status: DC
  Administered 2022-04-18 – 2022-04-19 (×2): 14 mg via TRANSDERMAL
  Filled 2022-04-18 (×2): qty 1

## 2022-04-18 MED ORDER — ONDANSETRON 4 MG PO TBDP
4.0000 mg | ORAL_TABLET | Freq: Four times a day (QID) | ORAL | Status: DC | PRN
Start: 2022-04-18 — End: 2022-04-19

## 2022-04-18 MED ORDER — CLONIDINE HCL 0.1 MG PO TABS
0.1000 mg | ORAL_TABLET | Freq: Four times a day (QID) | ORAL | Status: DC
  Administered 2022-04-18 – 2022-04-19 (×3): 0.1 mg via ORAL
  Filled 2022-04-18 (×3): qty 1

## 2022-04-18 MED ORDER — TRAZODONE HCL 50 MG PO TABS: 50.0000 mg | ORAL_TABLET | Freq: Every evening | ORAL | Status: DC | PRN

## 2022-04-18 MED ORDER — MAGNESIUM HYDROXIDE 400 MG/5ML PO SUSP: 30.0000 mL | Freq: Every day | ORAL | Status: DC | PRN

## 2022-04-18 MED ORDER — DICYCLOMINE HCL 20 MG PO TABS: 20.0000 mg | ORAL_TABLET | Freq: Four times a day (QID) | ORAL | Status: DC | PRN

## 2022-04-18 MED ORDER — SERTRALINE HCL 50 MG PO TABS
50.0000 mg | ORAL_TABLET | Freq: Every day | ORAL | Status: DC
  Administered 2022-04-19: 50 mg via ORAL
  Filled 2022-04-18: qty 1

## 2022-04-18 MED ORDER — ALUM & MAG HYDROXIDE-SIMETH 200-200-20 MG/5ML PO SUSP: 30.0000 mL | ORAL | Status: DC | PRN

## 2022-04-18 NOTE — ED Notes (Signed)
Dash called for stat lab  

## 2022-04-18 NOTE — ED Notes (Signed)
Pregnancy negative.  

## 2022-04-18 NOTE — ED Provider Notes (Signed)
Facility Based Crisis Admission H&P  Date: 04/18/22 Patient Name: Katie Robertson MRN: [id number] Chief Complaint: No chief complaint on file.     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 Patient is assessed, face-to-face, by nurse practitioner, seated in assessment area, no acute distress.  She  is alert and oriented, pleasant and cooperative during assessment.   [place] states " I need to detox, I went to [facility] but they have no beds, Sharyn Lull (from [facility] admission team) told me I should come here."  Patient reports she uses both fentanyl and meth daily.  Last use of both substances earlier this date.  She has used fentanyl daily for approximately 3 years and methamphetamine daily for approximately 10 years.  She has sought substance use treatment at [facility] twice in the past however at that time she realized "I was not ready."  She reports she is ready today to seek sobriety and is committed to her recovery.  She endorses rare cocaine use, most recent cocaine use 1 week ago.  She denies substance use aside from fentanyl, methamphetamine and cocaine.  Patient is an everyday tobacco cigarette smoker.  Current stressors include a "DSS case, I want to get my babies back."  Patient's oldest child is in another state with their father, her youngest 2 children are in foster care and she is working to be reunited with her children.  Patient has been diagnosed with depression and anxiety.  She has not been linked with outpatient psychiatry for approximately 3 years.  No current medications to address mood.  She denies history of inpatient psychiatric admission/treatment.  Family mental health history includes patient's maternal aunt who been diagnosed with schizophrenia.  Patient  presents with depressed mood, tearful affect.. She   denies suicidal and homicidal ideations. Denies history of suicide attempts, denies history of nonsuicidal self-harm behavior.  Patient easily  contracts verbally for safety with this Probation officer.    Patient has normal speech and behavior.  She  denies auditory and visual hallucinations.  Patient is able to converse coherently with goal-directed thoughts and no distractibility or preoccupation.  Denies symptoms of paranoia.  Objectively there is no evidence of psychosis/mania or delusional thinking.  Hani resides in [place], she denies access to weapons.  She is not currently employed.  Patient endorses average appetite and decreased sleep, potentially related to substance use.  Patient offered support and encouragement.  Patient verbalized understanding of treatment plan.  Reviewed medications including sertraline, and clonidine, discussed side effects.  Patient given opportunity to ask questions.  She verbalizes agreement with current treatment plan.   PHQ 2-9:  [facility] Visit from 10/16/2021 in [place] [facility] Visit from 12/14/2019 in [place]  Thoughts that you would be better off dead, or of hurting yourself in some way Not at all Not at all  PHQ-9 Total Score 7 10       Flowsheet Row Admission (Discharged) from 09/26/2021 in [place] [facility] ED to Hosp-Admission (Discharged) from 09/21/2021 in [place] No Risk No Risk        Total Time spent with patient: 30 minutes  Musculoskeletal  Strength & Muscle Tone: within normal limits Gait & Station: normal Patient leans: N/A  Psychiatric Specialty Exam  Presentation General Appearance: Appropriate for Environment; Casual  Eye Contact:Good  Speech:Clear and Coherent; Normal Rate  Speech Volume:Normal  Handedness:Right   Mood and Affect  Mood:Depressed  Affect:Congruent;  Tearful   Thought Process  Thought Processes:Coherent; Goal Directed; Linear  Descriptions of Associations:Intact  Orientation:Full (Time, Place and Person)  Thought Content:Logical; WDL    Hallucinations:Hallucinations: None  Ideas of Reference:None  Suicidal Thoughts:Suicidal Thoughts: No  Homicidal Thoughts:Homicidal Thoughts: No   Sensorium  Memory:Immediate Good; Recent Good  Judgment:Good  Insight:Fair   Executive Functions  Concentration:Good  Attention Span:Good  [place] of Knowledge:Good  Language:Good   Psychomotor Activity  Psychomotor Activity:Psychomotor Activity: Normal   Assets  Assets:Communication Skills; Desire for Improvement; Financial Resources/Insurance; Housing; Intimacy; Leisure Time; Physical Health; Resilience; Social Support   Sleep  Sleep:Sleep: Fair   Nutritional Assessment (For OBS and [facility] admissions only) Has the patient had a weight loss or gain of 10 pounds or more in the last 3 months?: No Has the patient had a decrease in food intake/or appetite?: No Does the patient have dental problems?: No Does the patient have eating habits or behaviors that may be indicators of an eating disorder including binging or inducing vomiting?: No Has the patient recently lost weight without trying?: 0 Has the patient been eating poorly because of a decreased appetite?: 0 Malnutrition Screening Tool Score: 0    Physical Exam Vitals and nursing note reviewed.  Constitutional:      Appearance: Normal appearance. She is well-developed.  HENT:     Head: Normocephalic and atraumatic.     Nose: Nose normal.  Cardiovascular:     Rate and Rhythm: Normal rate.  Pulmonary:     Effort: Pulmonary effort is normal.  Musculoskeletal:        General: Normal range of motion.     Cervical back: Normal range of motion.  Skin:    General: Skin is warm and dry.  Neurological:     Mental Status: She is alert and oriented to person,  place, and time.  Psychiatric:        Attention and Perception: Attention and perception normal.        Mood and Affect: Mood is depressed. Affect is tearful.        Speech: Speech normal.        Behavior: Behavior normal. Behavior is cooperative.        Thought Content: Thought content normal.        Cognition and Memory: Cognition and memory normal.    Review of Systems  Constitutional: Negative.   HENT: Negative.    Eyes: Negative.   Respiratory: Negative.    Cardiovascular: Negative.   Gastrointestinal: Negative.   Genitourinary: Negative.   Musculoskeletal: Negative.   Skin: Negative.   Neurological: Negative.   Psychiatric/Behavioral:  Positive for depression and substance abuse.     Blood pressure (!) 140/98, pulse 100, temperature 98.1 F (36.7 C), temperature source Oral, resp. rate 18, SpO2 95 %. There is no height or weight on file to calculate BMI.  Past Psychiatric History: Depression, anxiety, polysubstance use disorder  Is the patient at risk to self? No  Has the patient been a risk to self in the past 6 months? No .    Has the patient been a risk to self within the distant past? No   Is the patient a risk to others? No   Has the patient been a risk to others in the past 6 months? No  Has the patient been a risk to others within the distant past? No   Past Medical History:  Past Medical History:  Diagnosis Date   Hydrocephalus (HCC)    Hydrocephalus (HCC) 12/14/2019   As a baby and when she was 9 yrs of age   Kidney stone    Preeclampsia     Past Surgical History:  Procedure Laterality Date   CHOLECYSTECTOMY     LITHOTRIPSY     ORIF ANKLE FRACTURE Right 09/24/2021   Procedure: OPEN REDUCTION INTERNAL FIXATION (ORIF) ANKLE FRACTURE;  Surgeon: Roby Lofts, MD;  Location: [facility] OR;  Service: Orthopedics;  Laterality: Right;    Family History:  Family History  Problem Relation Age of Onset   Hypertension Mother    Diabetes Maternal Grandmother      Social History:  Social History   Socioeconomic History   Marital status: Unknown    Spouse name: Not on file   Number of children: Not on file   Years of education: Not on file   Highest education level: Not on file  Occupational History   Not on file  Tobacco Use   Smoking status: Every Day    Packs/day: 0.50    Years: 10.00    Total pack years: 5.00    Types: Cigarettes   Smokeless tobacco: Never  Vaping Use   Vaping Use: Never used  Substance and Sexual Activity   Alcohol use: Yes   Drug use: Never   Sexual activity: Not Currently  Other Topics Concern   Not on file  Social History Narrative   Not on file   Social Determinants of Health   Financial Resource Strain: Not on file  Food Insecurity: Not on file  Transportation Needs: Not on file  Physical Activity: Not on file  Stress: Not on file  Social Connections: Not on file  Intimate Partner Violence: Not on file    SDOH:  SDOH Screenings   Depression (PHQ2-9): Low Risk  (11/15/2021)  Recent Concern: Depression (PHQ2-9) - Medium Risk (10/16/2021)  Tobacco Use: High Risk (11/15/2021)    Last Labs:  Office Visit on 11/15/2021  Component Date Value Ref Range Status   Summary 11/15/2021 Note   Final   Comment: ==================================================================== ToxAssure Select,+Antidepr,UR ==================================================================== Test                             Result       Flag       Units  Drug Present   Methamphetamine                >3067                   ng/mg creat   Amphetamine                    1169                    ng/mg creat    Sources of methamphetamine include illicit sources, as a scheduled    prescription medication, as a metabolite of some prescription drugs,    or use of an l-methamphetamine inhaler.     Amphetamine is an expected metabolite of methamphetamine.    Amphetamine is also available as a schedule II prescription drug.     Fentanyl                       >  307                    ng/mg creat   Norfentanyl                    >613                    ng/mg creat    Source of fentanyl is a scheduled prescription medication, including    IV, patch, and transmucosal formulations. Norfentanyl is an expected                              metabolite of fentanyl.  ==================================================================== Test                      Result    Flag   Units      Ref Range   Creatinine              163              mg/dL      >=81 ==================================================================== Declared Medications:  Medication list was not provided. ==================================================================== For clinical consultation, please call [phone]. ====================================================================     Allergies: Patient has no known allergies.  PTA Medications: (Not in a hospital admission)   Long Term Goals: Improvement in symptoms so as ready for discharge  Short Term Goals: Patient will verbalize feelings in meetings with treatment team members., Patient will attend at least of 50% of the groups daily., Pt will complete the PHQ9 on admission, day 3 and discharge., Patient will participate in completing the [place] Suicide Severity Rating Scale, Patient will score a low risk of violence for 24 hours prior to discharge, and Patient will take medications as prescribed daily.  Medical Decision Making  Patient reviewed with Dr. Nelly Rout.  Patient remains voluntary.  Patient will be placed in facility based crisis unit for treatment and stabilization.  Laboratory studies ordered including CBC, CMP, ethanol, A1c, hepatic function, lipid panel, magnesium and TSH.  Urine pregnancy and urine drug screen ordered.  EKG order initiated.  Current medications: -Acetaminophen 650 mg every 6 as needed/mild pain -Maalox 30 mL oral every 4 as  needed/digestion -Hydroxyzine 25 mg 3 times daily as needed/anxiety -Magnesium hydroxide 30 mL daily as needed/mild constipation -Sertraline 50 mg daily/mood -Trazodone 50 mg nightly as needed/sleep -Nicotine transdermal 14 mg patch daily/nicotine withdrawal symptom  Clonidine Detox protocol initiated: -Clonidine 0.1 mg 4 daily x10 doses, clonidine 0.1 mg every morning and nightly x4 doses, clonidine 0.1 mg daily before breakfast x2 doses -Dicyclomine 20 mg every 6 hours as needed/spasms or abdominal cramping -Loperamide 2 to 4 mg oral as needed/diarrhea or loose stools -Methocarbamol 500 mg every 8 hours as needed/muscle spasms -Naproxen 500 mg twice daily as needed/aching, pain or discomfort -Ondansetron disintegrating tablet 4mg  every 8 hours as needed/nausea or vomiting      Recommendations  Based on my evaluation the patient does not appear to have an emergency medical condition.    , FNP 04/18/22  7:39 PM

## 2022-04-18 NOTE — ED Notes (Signed)
Pt is in the bed sleeping. Respirations are even and unlabored. No acute distress noted. Will continue to monitor for safety. 
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
## 2022-04-19 ENCOUNTER — Encounter (HOSPITAL_COMMUNITY): Payer: Self-pay

## 2022-04-19 DIAGNOSIS — F1120 Opioid dependence, uncomplicated: Secondary | ICD-10-CM | POA: Diagnosis not present

## 2022-04-19 DIAGNOSIS — Z20822 Contact with and (suspected) exposure to covid-19: Secondary | ICD-10-CM | POA: Diagnosis not present

## 2022-04-19 DIAGNOSIS — F1520 Other stimulant dependence, uncomplicated: Secondary | ICD-10-CM | POA: Diagnosis not present

## 2022-04-19 DIAGNOSIS — Z87442 Personal history of urinary calculi: Secondary | ICD-10-CM | POA: Diagnosis not present

## 2022-04-19 MED ORDER — FOLIC ACID 1 MG PO TABS
0.5000 mg | ORAL_TABLET | Freq: Once | ORAL | Status: AC
  Administered 2022-04-19: 0.5 mg via ORAL
  Filled 2022-04-19: qty 1

## 2022-04-19 NOTE — Discharge Instructions (Signed)
Dear Waldon Merl,  Most effective treatment for your mental health disease involves BOTH a psychiatrist AND a therapist Psychiatrist to manage medications Therapist to help identify personal goals, barriers from those goals, and plan to achieve those goals by understanding emotions Please make regular appointments with an outpatient psychiatrist and other doctors once you leave the hospital (if any, otherwise, please see below for resources to make an appointment).  For therapy outside the hospital, please ask for these specific types of therapy: DBT ________________________________________________________  SAFETY  Dial 988 for National Suicide & Crisis Lifeline    Text [phone] for Crisis Text Line:     [facility]:  [address] FIRST FLOOR.  [place], [place] [address].  [phone]  Mobile Crisis Response Teams Listed by counties in vicinity of [facility] providers [facility] [phone] [facility] [phone] [facility] [phone] [facility] [phone] [place]                * [facility] [phone]                * [facility] [phone] [facility] [phone] [facility]  [phone] * [facility] [phone] ________________________________________________________  To see which pharmacy near you is the CHEAPEST for certain medications, please use GoodRx. It is free website and has a free phone app.    Also consider looking at [facility] $4.00 or [facility]'s $7.00 prescription list. Both are free to view if googled "[facility] $4 prescription" and "public's $7 prescription". These are set prices, no insurance required. [facility] low cost medications: $4-$15 for 30days prescriptions or  $10-$38 for 90days prescriptions  ________________________________________________________  For issues with sleep, please use this free app for insomnia called CBT-I. Let your doctors and therapists know so they can help with extra tips and tricks or for guidance and accountability. NO ADDS on the app.     ________________________________________________________  [facility] [address], SECOND FLOOR [place], [place] [address] [phone] OUTPATIENT Walk-in information: Please note, all walk-ins are first come & first serve, with limited number of availability.  Please note that to be eligible for services you must bring: ID or a piece of mail with your name [facility] address  Therapist for therapy:  Monday & Wednesdays: Please ARRIVE at 7:15 AM for registration Will START at 8:00 AM Every 1st & 2nd Friday of the month: Please ARRIVE at 10:15 AM for registration Will START at 1 PM - 5 PM  Psychiatrist for medication management: Monday - Friday:  Please ARRIVE at 7:15 AM for registration Will START at 8:00 AM  Regretfully, due to limited availability, please be aware that you may not been seen on the same day as walk-in. Please consider making an appoint or try again. Thank you for your patience and understanding.

## 2022-04-19 NOTE — BH IP Treatment Plan (Signed)
Interdisciplinary Treatment and Diagnostic Plan Update  04/19/2022 Time of Session: [id number] Katie Robertson MRN: [id number]  Diagnosis:  Final diagnoses:  Methamphetamine use disorder, severe ([place])  Fentanyl use disorder, severe (HCC)     Current Medications:  Current Facility-Administered Medications  Medication Dose Route Frequency Provider Last Rate Last Admin   acetaminophen (TYLENOL) tablet 650 mg  650 mg Oral Q6H PRN Lucky Rathke, FNP       alum & mag hydroxide-simeth (MAALOX/MYLANTA) 200-200-20 MG/5ML suspension 30 mL  30 mL Oral Q4H PRN Lucky Rathke, FNP       cloNIDine (CATAPRES) tablet 0.1 mg  0.1 mg Oral QID Lucky Rathke, FNP   0.1 mg at 04/19/22 0915   Followed by   Derrill Memo ON 04/21/2022] cloNIDine (CATAPRES) tablet 0.1 mg  0.1 mg Oral Champ Mungo, FNP       Followed by   Derrill Memo ON 04/24/2022] cloNIDine (CATAPRES) tablet 0.1 mg  0.1 mg Oral QAC breakfast Lucky Rathke, FNP       dicyclomine (BENTYL) tablet 20 mg  20 mg Oral Q6H PRN Lucky Rathke, FNP       hydrOXYzine (ATARAX) tablet 25 mg  25 mg Oral TID PRN Lucky Rathke, FNP   25 mg at 04/19/22 0916   loperamide (IMODIUM) capsule 2-4 mg  2-4 mg Oral PRN Lucky Rathke, FNP       magnesium hydroxide (MILK OF MAGNESIA) suspension 30 mL  30 mL Oral Daily PRN Lucky Rathke, FNP       methocarbamol (ROBAXIN) tablet 500 mg  500 mg Oral Q8H PRN Lucky Rathke, FNP       naproxen (NAPROSYN) tablet 500 mg  500 mg Oral BID PRN Lucky Rathke, FNP       nicotine (NICODERM CQ - dosed in mg/24 hours) patch 14 mg  14 mg Transdermal Daily Lucky Rathke, FNP   14 mg at 04/19/22 0914   ondansetron (ZOFRAN-ODT) disintegrating tablet 4 mg  4 mg Oral Q6H PRN Lucky Rathke, FNP       sertraline (ZOLOFT) tablet 50 mg  50 mg Oral Daily Lucky Rathke, FNP   50 mg at 04/19/22 0915   traZODone (DESYREL) tablet 50 mg  50 mg Oral QHS PRN Lucky Rathke, FNP       No current outpatient medications on file.   PTA Medications: Prior to  Admission medications   Not on File    Patient Stressors: Substance abuse    Patient Strengths: Motivation for treatment/growth   Treatment Modalities: Medication Management, Group therapy, Case management,  1 to 1 session with clinician, Psychoeducation, Recreational therapy.   Physician Treatment Plan for Primary and Secondary Diagnosis:  Final diagnoses:  Methamphetamine use disorder, severe (HCC)  Fentanyl use disorder, severe ([place])   Long Term Goal(s): Improvement in symptoms so as ready for discharge  Short Term Goals: Patient will verbalize feelings in meetings with treatment team members. Patient will attend at least of 50% of the groups daily. Pt will complete the PHQ9 on admission, day 3 and discharge. Patient will participate in completing the [place] Patient will score a low risk of violence for 24 hours prior to discharge Patient will take medications as prescribed daily.  Medication Management: Evaluate patient's response, side effects, and tolerance of medication regimen.  Therapeutic Interventions: 1 to 1 sessions, Unit Group sessions and Medication administration.  Evaluation of Outcomes: Progressing  LCSW  Treatment Plan for Primary Diagnosis:  Final diagnoses:  Methamphetamine use disorder, severe (HCC)  Fentanyl use disorder, severe ([place])    Long Term Goal(s): Safe transition to appropriate next level of care at discharge.  Short Term Goals: Facilitate acceptance of mental health diagnosis and concerns through verbal commitment to aftercare plan and appointments at discharge., Identify minimum of 2 triggers associated with mental health/substance abuse issues with treatment team members., and Increase skills for wellness and recovery by attending 50% of scheduled groups.  Therapeutic Interventions: Assess for all discharge needs, 1 to 1 time with Education officer, museum, Explore available resources and support systems, Assess for  adequacy in community support network, Educate family and significant other(s) on suicide prevention, Complete Psychosocial Assessment, Interpersonal group therapy.  Evaluation of Outcomes: Not Met   Progress in Treatment: Attending groups: No.Just arrived Participating in groups: No. Taking medication as prescribed: Yes. Toleration medication: Yes. Family/Significant other contact made: No, will contact:    Patient understands diagnosis: Yes. Discussing patient identified problems/goals with staff: Yes. Medical problems stabilized or resolved: Yes. Denies suicidal/homicidal ideation: Yes. Issues/concerns per patient self-inventory: No. Other: none  New problem(s) identified: No, Describe:  none  New Short Term/Long Term Goal(s):  Patient Goals:  get clean from substances, pursue residential substance abuse treatment  Discharge Plan or Barriers: residential substance abuse treatment  Reason for Continuation of Hospitalization: Medication stabilization Withdrawal symptoms  Estimated Length of Stay:1-3 days  Last 3 [place] Suicide Severity Risk Score: [place] ED from 04/18/2022 in [facility] Admission (Discharged) from 09/26/2021 in [place] A ED to Hosp-Admission (Discharged) from 09/21/2021 in [place] No Risk No Risk No Risk       Last PHQ 2/9 Scores:    11/15/2021   10:23 AM 10/16/2021   11:21 AM 12/14/2019    8:58 AM  Depression screen PHQ 2/9  Decreased Interest 1 1 0  Down, Depressed, Hopeless 1 1 1   PHQ - 2 Score 2 2 1   Altered sleeping  3 2  Tired, decreased energy  1 2  Change in appetite  1 3  Feeling bad or failure about yourself   0 2  Trouble concentrating  0 0  Moving slowly or fidgety/restless  0 0  Suicidal thoughts  0 0  PHQ-9 Score  7 10  Difficult doing work/chores  Not difficult at all Not difficult at all    Scribe for Treatment  Team: Joanne Chars, LCSW 04/19/2022 10:46 AM
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
## 2022-04-19 NOTE — [facility] Group Notes (Signed)
LCSW Wellness Group Note  04/19/2022 2:00pm  Type of Group and Topic: Psychoeducational Group:  Wellness  Participation Level:  Active  Description of Group  Wellness group introduces the topic and its focus on developing healthy habits across the spectrum and its relationship to a decrease in hospital admissions.  Six areas of wellness are discussed: physical, social spiritual, intellectual, occupational, and emotional.  Patients are asked to consider their current wellness habits and to identify areas of wellness where they are interested and able to focus on improvements.    Therapeutic Goals Patients will understand components of wellness and how they can positively impact overall health.  Patients will identify areas of wellness where they have developed good habits. Patients will identify areas of wellness where they would like to make improvements.    Summary of Patient Progress: pt had limited participation in group, was attentive to the discussion, did respond to CSW questions.  Pt identified social and intellectual as positive wellness areas in her life. Pt identified emotional wellness as an area that needed work.       Therapeutic Modalities: Cognitive Behavioral Therapy Psychoeducation    Lorri Frederick, LCSW

## 2022-04-19 NOTE — ED Notes (Signed)
Patient asleep in bed at present without distress or complaint.

## 2022-04-19 NOTE — ED Notes (Signed)
The group was about clean and sober with Katie Robertson

## 2022-04-19 NOTE — ED Notes (Signed)
Pt is in the bed sleeping. Respirations are even and unlabored. No acute distress noted. Will continue to monitor for safety. 

## 2022-04-19 NOTE — ED Notes (Signed)
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 Patient was cooperative during the admission assessment. Skin assessment complete. Belongings in the locker. Patient oriented to unit and unit rules. Meal and drinks offered to patient.  Patient verbalized agreement to treatment plans. Patient verbally contracts for safety while hospitalized. Will monitor for safety.

## 2022-04-19 NOTE — Progress Notes (Signed)
CSW sent pt referral to [facility], confirmed with [facility] that they can accept [facility] resident.  [facility] not open currently due to a move, anticipates opening in 1-2 weeks, they are doing phone assessments and placing pts on wait list.  CSW spoke to pt and provided this information along with phone number for phone assessment.  Pt verbalized understanding. Daleen Squibb, MSW, LCSW 9/8/20233:08 PM

## 2022-04-20 LAB — PROLACTIN: Prolactin: 4.1 ng/mL — ABNORMAL LOW (ref 4.8–23.3)

## 2022-11-25 DIAGNOSIS — K047 Periapical abscess without sinus: Secondary | ICD-10-CM | POA: Diagnosis not present

## 2022-11-25 DIAGNOSIS — K089 Disorder of teeth and supporting structures, unspecified: Secondary | ICD-10-CM | POA: Diagnosis not present

## 2022-11-25 DIAGNOSIS — B3731 Acute candidiasis of vulva and vagina: Secondary | ICD-10-CM | POA: Diagnosis not present

## 2022-11-25 DIAGNOSIS — N72 Inflammatory disease of cervix uteri: Secondary | ICD-10-CM | POA: Diagnosis not present

## 2023-02-16 DIAGNOSIS — R109 Unspecified abdominal pain: Secondary | ICD-10-CM | POA: Diagnosis not present

## 2023-02-16 DIAGNOSIS — S335XXA Sprain of ligaments of lumbar spine, initial encounter: Secondary | ICD-10-CM | POA: Diagnosis not present

## 2023-02-16 DIAGNOSIS — X58XXXA Exposure to other specified factors, initial encounter: Secondary | ICD-10-CM | POA: Diagnosis not present

## 2023-02-16 DIAGNOSIS — N390 Urinary tract infection, site not specified: Secondary | ICD-10-CM | POA: Diagnosis not present

## 2023-02-16 DIAGNOSIS — K6389 Other specified diseases of intestine: Secondary | ICD-10-CM | POA: Diagnosis not present

## 2023-02-17 DIAGNOSIS — K6389 Other specified diseases of intestine: Secondary | ICD-10-CM | POA: Diagnosis not present

## 2023-02-17 DIAGNOSIS — S335XXA Sprain of ligaments of lumbar spine, initial encounter: Secondary | ICD-10-CM | POA: Diagnosis not present

## 2023-02-17 DIAGNOSIS — R109 Unspecified abdominal pain: Secondary | ICD-10-CM | POA: Diagnosis not present

## 2023-02-17 DIAGNOSIS — X58XXXA Exposure to other specified factors, initial encounter: Secondary | ICD-10-CM | POA: Diagnosis not present

## 2023-02-17 DIAGNOSIS — N390 Urinary tract infection, site not specified: Secondary | ICD-10-CM | POA: Diagnosis not present
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
## 2023-03-31 IMAGING — DX DG KNEE 1-2V*R*
1 series · 2 of 2 positions shown · non-contrast
Comparison: None.

CLINICAL DATA: Fracture distal right tibia and fibula

EXAM:
RIGHT KNEE - 1-2 VIEW

[Series 1: knee · 0.14mm/px · 2 of 2 slices shown]
[im 1/2]
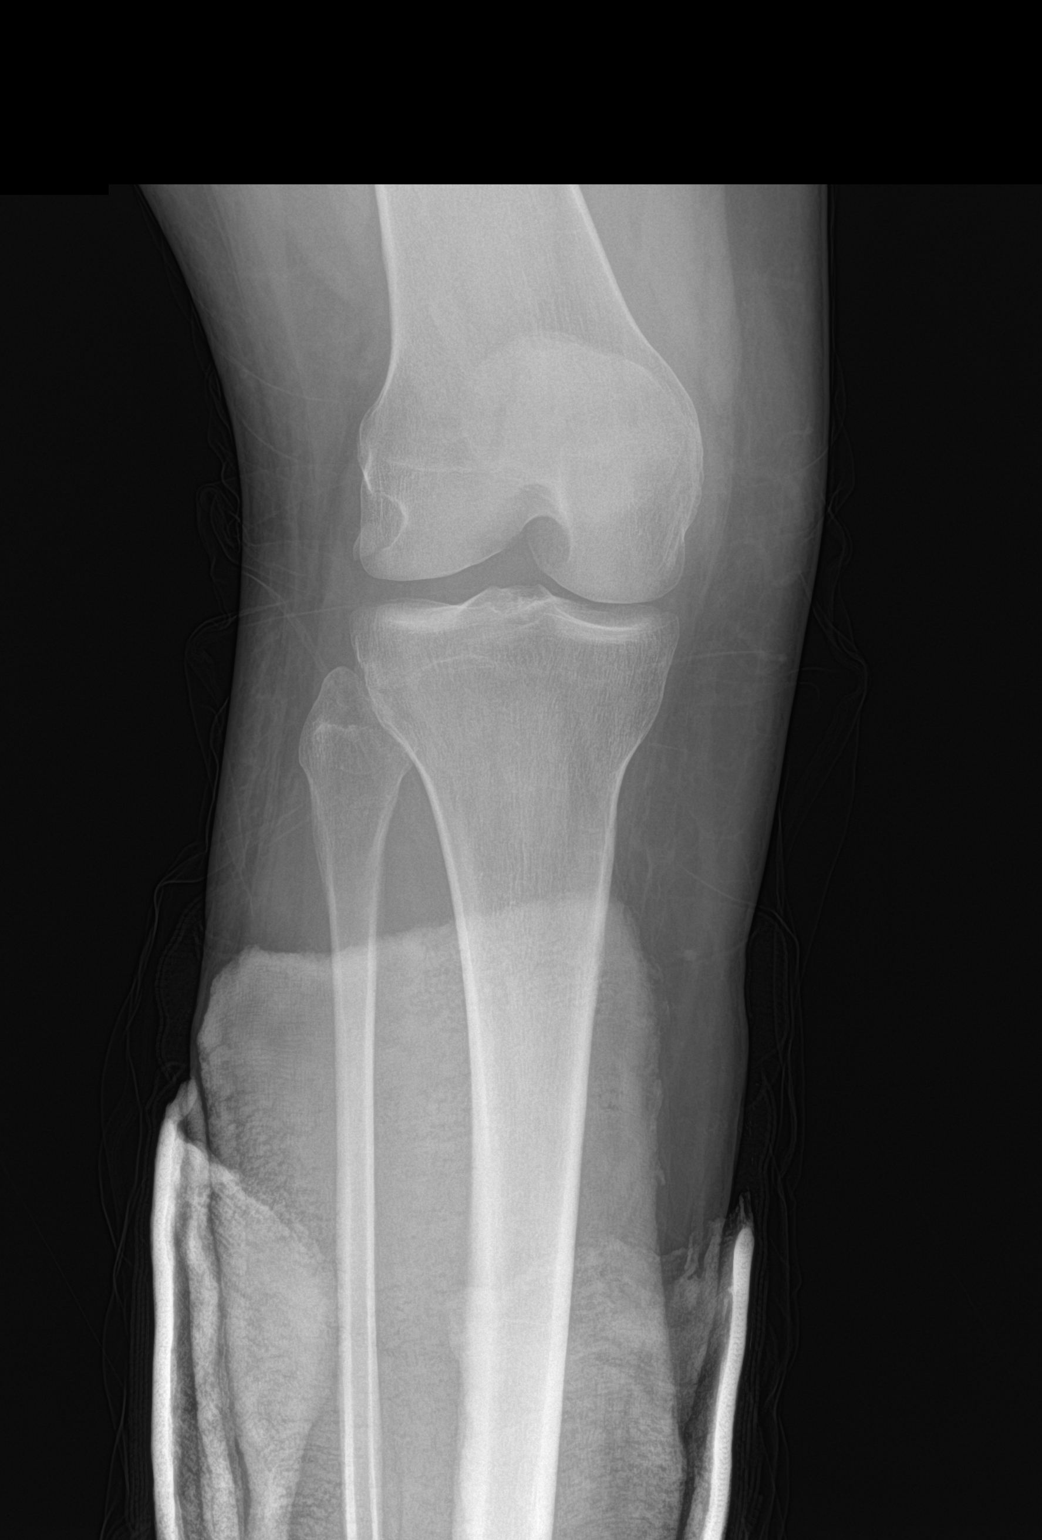
[im 2/2]
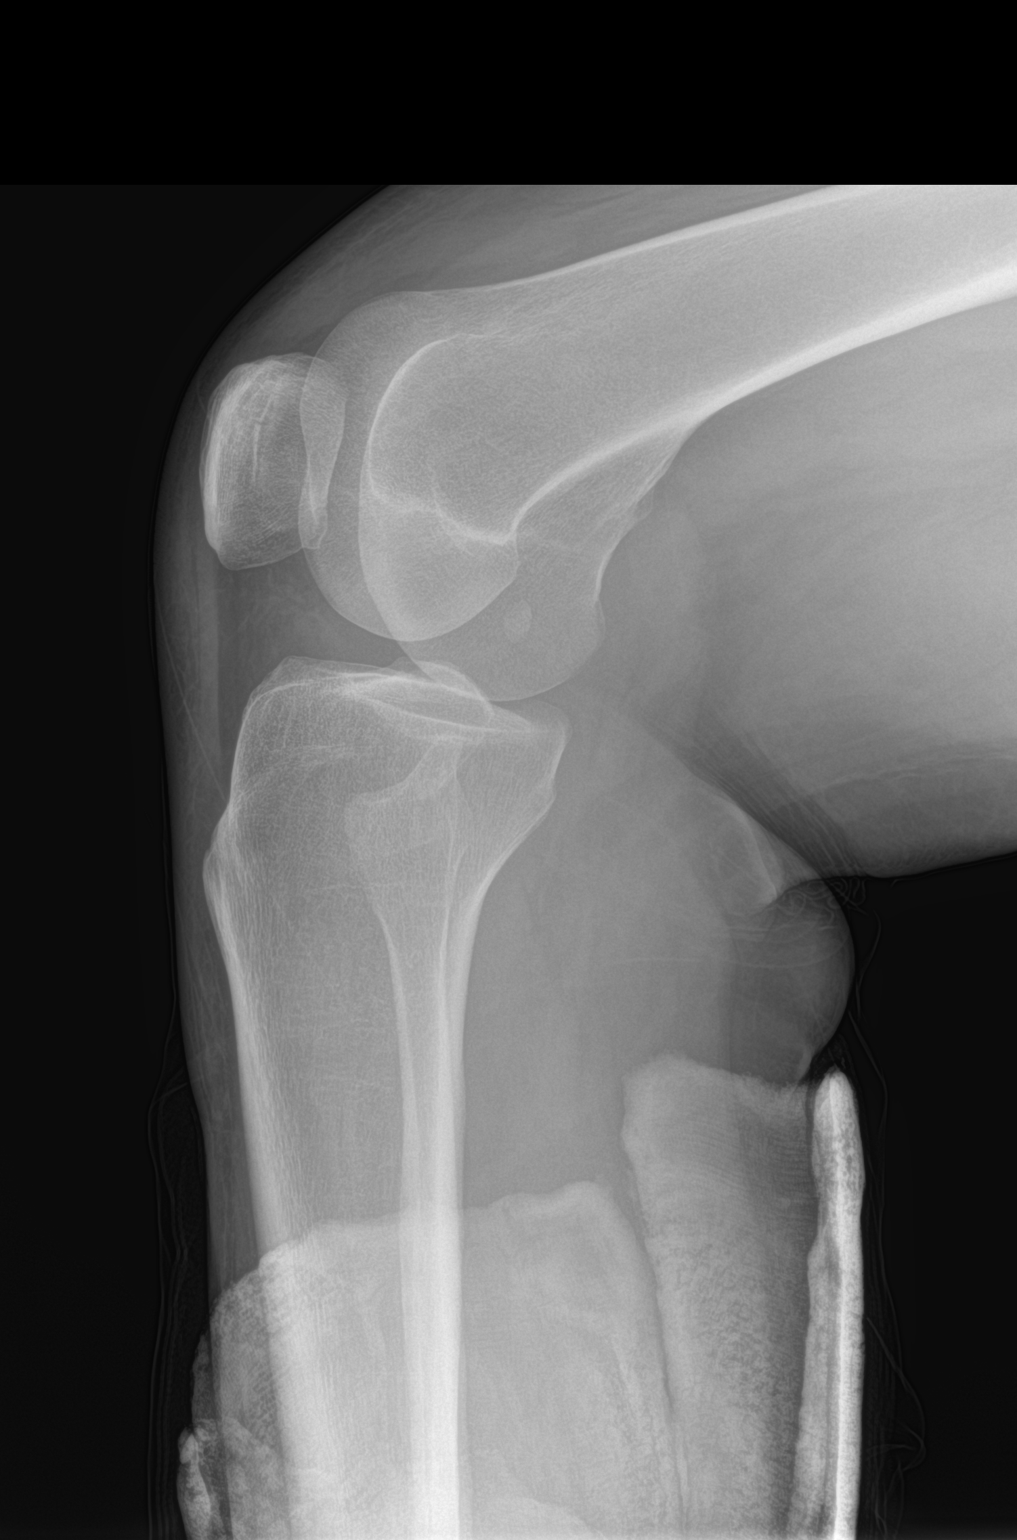

[2 of 2 positions shown; findings below may reference images not displayed]

FINDINGS: No fracture or dislocation is seen. There is no significant effusion
in the suprapatellar bursa. Plaster cast is noted in lower leg.
IMPRESSION: No fracture or dislocation is seen in the right knee.

## 2023-04-07 DIAGNOSIS — Z113 Encounter for screening for infections with a predominantly sexual mode of transmission: Secondary | ICD-10-CM | POA: Diagnosis not present

## 2023-04-07 DIAGNOSIS — B3731 Acute candidiasis of vulva and vagina: Secondary | ICD-10-CM | POA: Diagnosis not present

## 2023-04-07 DIAGNOSIS — Z3202 Encounter for pregnancy test, result negative: Secondary | ICD-10-CM | POA: Diagnosis not present

## 2023-04-07 DIAGNOSIS — N72 Inflammatory disease of cervix uteri: Secondary | ICD-10-CM | POA: Diagnosis not present

## 2023-04-07 DIAGNOSIS — N760 Acute vaginitis: Secondary | ICD-10-CM | POA: Diagnosis not present

## 2023-04-22 DIAGNOSIS — F1120 Opioid dependence, uncomplicated: Secondary | ICD-10-CM | POA: Diagnosis not present

## 2023-04-25 DIAGNOSIS — F1120 Opioid dependence, uncomplicated: Secondary | ICD-10-CM | POA: Diagnosis not present

## 2023-06-18 DIAGNOSIS — F17210 Nicotine dependence, cigarettes, uncomplicated: Secondary | ICD-10-CM | POA: Diagnosis not present

## 2023-06-18 DIAGNOSIS — O99331 Smoking (tobacco) complicating pregnancy, first trimester: Secondary | ICD-10-CM | POA: Diagnosis not present

## 2023-06-18 DIAGNOSIS — M545 Low back pain, unspecified: Secondary | ICD-10-CM | POA: Diagnosis not present

## 2023-06-18 DIAGNOSIS — Z3A01 Less than 8 weeks gestation of pregnancy: Secondary | ICD-10-CM | POA: Diagnosis not present

## 2023-06-18 DIAGNOSIS — O99891 Other specified diseases and conditions complicating pregnancy: Secondary | ICD-10-CM | POA: Diagnosis not present

## 2023-10-29 DIAGNOSIS — O331 Maternal care for disproportion due to generally contracted pelvis: Secondary | ICD-10-CM | POA: Diagnosis not present

## 2023-10-29 DIAGNOSIS — R1084 Generalized abdominal pain: Secondary | ICD-10-CM | POA: Diagnosis not present

## 2023-10-29 DIAGNOSIS — Z331 Pregnant state, incidental: Secondary | ICD-10-CM | POA: Diagnosis not present

## 2023-12-30 DIAGNOSIS — Z3483 Encounter for supervision of other normal pregnancy, third trimester: Secondary | ICD-10-CM | POA: Diagnosis not present

## 2023-12-30 DIAGNOSIS — O9902 Anemia complicating childbirth: Secondary | ICD-10-CM | POA: Diagnosis not present

## 2023-12-30 DIAGNOSIS — Z051 Observation and evaluation of newborn for suspected infectious condition ruled out: Secondary | ICD-10-CM | POA: Diagnosis not present

## 2023-12-30 DIAGNOSIS — Z2882 Immunization not carried out because of caregiver refusal: Secondary | ICD-10-CM | POA: Diagnosis not present

## 2023-12-30 DIAGNOSIS — O99334 Smoking (tobacco) complicating childbirth: Secondary | ICD-10-CM | POA: Diagnosis not present

## 2023-12-30 DIAGNOSIS — B9781 Human metapneumovirus as the cause of diseases classified elsewhere: Secondary | ICD-10-CM | POA: Diagnosis not present

## 2023-12-30 DIAGNOSIS — Z3A34 34 weeks gestation of pregnancy: Secondary | ICD-10-CM | POA: Diagnosis not present

## 2023-12-30 DIAGNOSIS — F1910 Other psychoactive substance abuse, uncomplicated: Secondary | ICD-10-CM | POA: Diagnosis not present

## 2023-12-30 DIAGNOSIS — Z23 Encounter for immunization: Secondary | ICD-10-CM | POA: Diagnosis not present

## 2023-12-30 DIAGNOSIS — Q690 Accessory finger(s): Secondary | ICD-10-CM | POA: Diagnosis not present

## 2023-12-30 DIAGNOSIS — I10 Essential (primary) hypertension: Secondary | ICD-10-CM | POA: Diagnosis not present

## 2023-12-30 DIAGNOSIS — D62 Acute posthemorrhagic anemia: Secondary | ICD-10-CM | POA: Diagnosis not present

## 2023-12-30 DIAGNOSIS — K0889 Other specified disorders of teeth and supporting structures: Secondary | ICD-10-CM | POA: Diagnosis not present

## 2023-12-30 DIAGNOSIS — Q692 Accessory toe(s): Secondary | ICD-10-CM | POA: Diagnosis not present

## 2023-12-30 DIAGNOSIS — F419 Anxiety disorder, unspecified: Secondary | ICD-10-CM | POA: Diagnosis not present

## 2023-12-30 DIAGNOSIS — R6819 Other nonspecific symptoms peculiar to infancy: Secondary | ICD-10-CM | POA: Diagnosis not present

## 2023-12-30 DIAGNOSIS — O99324 Drug use complicating childbirth: Secondary | ICD-10-CM | POA: Diagnosis not present

## 2023-12-30 DIAGNOSIS — O114 Pre-existing hypertension with pre-eclampsia, complicating childbirth: Secondary | ICD-10-CM | POA: Diagnosis not present

## 2023-12-30 DIAGNOSIS — O1414 Severe pre-eclampsia complicating childbirth: Secondary | ICD-10-CM | POA: Diagnosis not present

## 2023-12-30 DIAGNOSIS — Q699 Polydactyly, unspecified: Secondary | ICD-10-CM | POA: Diagnosis not present

## 2023-12-30 DIAGNOSIS — O99344 Other mental disorders complicating childbirth: Secondary | ICD-10-CM | POA: Diagnosis not present

## 2023-12-30 DIAGNOSIS — F17210 Nicotine dependence, cigarettes, uncomplicated: Secondary | ICD-10-CM | POA: Diagnosis not present

## 2023-12-30 DIAGNOSIS — Q7031 Webbed toes, right foot: Secondary | ICD-10-CM | POA: Diagnosis not present

## 2023-12-30 DIAGNOSIS — Z362 Encounter for other antenatal screening follow-up: Secondary | ICD-10-CM | POA: Diagnosis not present

## 2023-12-30 DIAGNOSIS — Z055 Observation and evaluation of newborn for suspected gastrointestinal condition ruled out: Secondary | ICD-10-CM | POA: Diagnosis not present

## 2023-12-30 DIAGNOSIS — Z3A33 33 weeks gestation of pregnancy: Secondary | ICD-10-CM | POA: Diagnosis not present

## 2023-12-30 DIAGNOSIS — Z9981 Dependence on supplemental oxygen: Secondary | ICD-10-CM | POA: Diagnosis not present
